# Patient Record
Sex: Female | Born: 2000 | Hispanic: Yes | Marital: Married | Smoking: Never smoker
Health system: Southern US, Community
[De-identification: ages and names within clinical notes are randomized; demographics above are authoritative.]

## PROBLEM LIST (undated history)

## (undated) DIAGNOSIS — M419 Scoliosis, unspecified: Secondary | ICD-10-CM

## (undated) HISTORY — DX: Scoliosis, unspecified: M41.9

## (undated) HISTORY — PX: TONSILLECTOMY: SUR1361

## (undated) HISTORY — PX: TONSILLECTOMY: SHX5217

---
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
## 2022-02-15 ENCOUNTER — Other Ambulatory Visit: Payer: Self-pay

## 2022-02-15 ENCOUNTER — Inpatient Hospital Stay (HOSPITAL_COMMUNITY)
Admission: AD | Admit: 2022-02-15 | Discharge: 2022-02-15 | Disposition: A | Discharge status: Home / Self Care | Payer: Medicaid Other | Attending: Obstetrics and Gynecology | Admitting: Obstetrics and Gynecology

## 2022-02-15 ENCOUNTER — Encounter (HOSPITAL_COMMUNITY): Payer: Self-pay

## 2022-02-15 DIAGNOSIS — Z3200 Encounter for pregnancy test, result unknown: Secondary | ICD-10-CM | POA: Insufficient documentation

## 2022-02-15 LAB — HCG, QUANTITATIVE, PREGNANCY: hCG, Beta Chain, Quant, S: 11 m[IU]/mL — ABNORMAL HIGH (ref <5)

## 2022-02-15 LAB — POCT PREGNANCY, URINE: Preg Test, Ur: NEGATIVE

## 2022-02-15 NOTE — MAU Note (Signed)
Pt okay to leave once labs are drawn per CNM.

## 2022-02-15 NOTE — MAU Note (Signed)
Katherine Barrera is a 22 y.o. here in MAU reporting: had multiple + UPT on Sunday. Last night started spotting and today it was a little bit heavier. Not having to wear a pad. Having some mild cramping.  LMP: 01/11/22  Onset of complaint: last night  Pain score: 4/10  Vitals:   02/15/22 1119  BP: 122/70  Pulse: 79  Resp: 16  Temp: 98.4 F (36.9 C)  SpO2: 95%     FHT:NA  Lab orders placed from triage: upt

## 2022-02-15 NOTE — MAU Provider Note (Signed)
Event Date/Time   First Provider Initiated Contact with Patient 02/15/22 1126      S Katherine Barrera is a 22 y.o. patient who presents to MAU today with complaint of spotting. She reports she had positive pregnancy tests on Sunday. She reports she started seeing spotting today. She denies any pain   O BP 122/70 (BP Location: Right Arm)   Pulse 79   Temp 98.4 F (36.9 C) (Oral)   Resp 16   Ht 5\' 3"  (1.6 m)   Wt 76.8 kg   LMP 01/11/2022   SpO2 95% Comment: room air  BMI 29.99 kg/m  Physical Exam Vitals and nursing note reviewed.  Constitutional:      General: She is not in acute distress.    Appearance: She is well-developed.  HENT:     Head: Normocephalic.  Eyes:     Pupils: Pupils are equal, round, and reactive to light.  Cardiovascular:     Rate and Rhythm: Normal rate and regular rhythm.     Heart sounds: Normal heart sounds.  Pulmonary:     Effort: Pulmonary effort is normal. No respiratory distress.     Breath sounds: Normal breath sounds.  Abdominal:     General: Bowel sounds are normal. There is no distension.     Palpations: Abdomen is soft.     Tenderness: There is no abdominal tenderness.  Skin:    General: Skin is warm and dry.  Neurological:     Mental Status: She is alert and oriented to person, place, and time.  Psychiatric:        Mood and Affect: Mood normal.        Behavior: Behavior normal.        Thought Content: Thought content normal.        Judgment: Judgment normal.    Results for orders placed or performed during the hospital encounter of 02/15/22 (from the past 24 hour(s))  Pregnancy, urine POC     Status: None   Collection Time: 02/15/22 11:15 AM  Result Value Ref Range   Preg Test, Ur NEGATIVE NEGATIVE  hCG, quantitative, pregnancy     Status: Abnormal   Collection Time: 02/15/22 11:35 AM  Result Value Ref Range   hCG, Beta Chain, Quant, S 11 (H) <5 mIU/mL     A Medical screening exam complete 1. Possible  pregnancy     Discussed negative UPT and recommended HCG. Patient agreeable to HCG.   Called patient to discuss positive HCG and recommendation for repeat in 48 hours. No appointments available in office, will return to MAU. Patient verbalized understanding of plan of care.   P -Discharge home in stable condition -Strict ectopic and miscarriage precautions discussed -Patient advised to follow-up with MAU on Friday for repeat labs -Patient may return to MAU as needed or if her condition were to change or worsen   Katherine Barrera, [place] 02/15/2022 11:26 AM

## 2022-02-17 ENCOUNTER — Inpatient Hospital Stay (HOSPITAL_COMMUNITY)
Admission: AD | Admit: 2022-02-17 | Discharge: 2022-02-17 | Disposition: A | Discharge status: Home / Self Care | Payer: Medicaid Other | Attending: Obstetrics and Gynecology | Admitting: Obstetrics and Gynecology

## 2022-02-17 ENCOUNTER — Encounter (HOSPITAL_COMMUNITY): Payer: Self-pay | Admitting: *Deleted

## 2022-02-17 ENCOUNTER — Inpatient Hospital Stay (HOSPITAL_COMMUNITY)
Admission: RE | Admit: 2022-02-17 | Discharge: 2022-02-17 | Disposition: A | Discharge status: Home / Self Care | Payer: Medicaid Other | Source: Ambulatory Visit | Attending: Obstetrics and Gynecology | Admitting: Obstetrics and Gynecology

## 2022-02-17 DIAGNOSIS — O039 Complete or unspecified spontaneous abortion without complication: Secondary | ICD-10-CM | POA: Diagnosis not present

## 2022-02-17 DIAGNOSIS — Z3A00 Weeks of gestation of pregnancy not specified: Secondary | ICD-10-CM

## 2022-02-17 LAB — HCG, QUANTITATIVE, PREGNANCY: hCG, Beta Chain, Quant, S: 2 m[IU]/mL (ref <5)

## 2022-02-17 NOTE — MAU Note (Signed)
Katherine Barrera Renold Don is a 22 y.o. at Unknown here in MAU reporting: here for repeat HCG.  Was here for spotting on Wed, when she got home she started cramping and her bleeding go heavier- like she was on her period.  Yesterday bleeding was lighter, never filled a pad. Today only seeing when she wipes or uses the restroom LMP: 12/27 Onset of complaint: Wed Pain score: none today Vitals:   02/17/22 1230  BP: 121/68  Pulse: 74  Resp: 16  Temp: 98.2 F (36.8 C)  SpO2: 99%      Lab orders placed from triage:  blood work has been drawn

## 2022-02-17 NOTE — MAU Provider Note (Signed)
History   Chief Complaint:  Follow-up  Katherine Barrera is  22 y.o. G1P0 Patient's last menstrual period was 01/11/2022.Marland Kitchen Patient is here for follow up of quantitative HCG and ongoing surveillance of pregnancy status. She is early pregnant  by LMP.   Since her last visit, the patient is without new complaint. The patient reports bleeding as  none now.  She denies any pain.  General ROS:  negative   Her previous Quantitative HCG values are:  1/31: 11  Physical Exam   Blood pressure 121/68, pulse 74, temperature 98.2 F (36.8 C), temperature source Oral, resp. rate 16, height 5\' 3"  (1.6 m), weight 75.9 kg, last menstrual period 01/11/2022, SpO2 99 %.   Physical Exam Vitals and nursing note reviewed.  Constitutional:      General: She is not in acute distress.    Appearance: She is well-developed.  HENT:     Head: Normocephalic.  Eyes:     Pupils: Pupils are equal, round, and reactive to light.  Cardiovascular:     Rate and Rhythm: Normal rate and regular rhythm.  Pulmonary:     Effort: Pulmonary effort is normal. No respiratory distress.     Breath sounds: Normal breath sounds.  Abdominal:     Palpations: Abdomen is soft.     Tenderness: There is no abdominal tenderness.  Musculoskeletal:        General: Normal range of motion.     Cervical back: Normal range of motion.  Skin:    General: Skin is warm and dry.  Neurological:     Mental Status: She is alert and oriented to person, place, and time.  Psychiatric:        Behavior: Behavior normal.        Thought Content: Thought content normal.        Judgment: Judgment normal.       Labs: Results for orders placed or performed during the hospital encounter of 02/17/22 (from the past 24 hour(s))  hCG, quantitative, pregnancy   Collection Time: 02/17/22 12:07 PM  Result Value Ref Range   hCG, Beta Chain, Quant, S 2 <5 mIU/mL   Assessment:   1. Miscarriage      Plan: -Discharge home in stable  condition -Patient advised to follow-up with OB as needed for gyn care -Patient may return to MAU as needed or if her condition were to change or worsen  [name], CNM 02/17/2022, 12:55 PM

## 2022-09-05 ENCOUNTER — Ambulatory Visit (INDEPENDENT_AMBULATORY_CARE_PROVIDER_SITE_OTHER): Payer: Medicaid Other | Admitting: *Deleted

## 2022-09-05 ENCOUNTER — Other Ambulatory Visit (INDEPENDENT_AMBULATORY_CARE_PROVIDER_SITE_OTHER): Payer: Medicaid Other

## 2022-09-05 VITALS — BP 123/82 | HR 83

## 2022-09-05 DIAGNOSIS — O3680X0 Pregnancy with inconclusive fetal viability, not applicable or unspecified: Secondary | ICD-10-CM

## 2022-09-05 DIAGNOSIS — Z1339 Encounter for screening examination for other mental health and behavioral disorders: Secondary | ICD-10-CM | POA: Diagnosis not present

## 2022-09-05 DIAGNOSIS — Z3A08 8 weeks gestation of pregnancy: Secondary | ICD-10-CM | POA: Diagnosis not present

## 2022-09-05 DIAGNOSIS — Z3481 Encounter for supervision of other normal pregnancy, first trimester: Secondary | ICD-10-CM

## 2022-09-05 DIAGNOSIS — Z3A09 9 weeks gestation of pregnancy: Secondary | ICD-10-CM

## 2022-09-05 DIAGNOSIS — Z3480 Encounter for supervision of other normal pregnancy, unspecified trimester: Secondary | ICD-10-CM

## 2022-09-05 MED ORDER — BLOOD PRESSURE KIT DEVI: 1.0000 | 0 refills | Status: AC

## 2022-09-05 NOTE — Progress Notes (Signed)
New OB Intake  I connected with Katherine Barrera  on 09/05/22 at  8:15 AM EDT by In Person Visit and verified that I am speaking with the correct person using two identifiers. Nurse is located at [facility] and pt is located at [place].  I discussed the limitations, risks, security and privacy concerns of performing an evaluation and management service by telephone and the availability of in person appointments. I also discussed with the patient that there may be a patient responsible charge related to this service. The patient expressed understanding and agreed to proceed.  I explained I am completing New OB Intake today. We discussed EDD of Not found.. Pt is G1P0. I reviewed her allergies, medications and Medical/Surgical/OB history.    Patient Active Problem List   Diagnosis Date Noted   Vitamin D deficiency 04/02/2017   Anxiety state 02/22/2015   Mild headache 02/22/2015   Numbness and tingling 02/22/2015   Chronic back pain 02/22/2015    Concerns addressed today  Delivery Plans Plans to deliver at [facility]. Discussed the nature of our practice with multiple providers including residents and students. Due to the size of the practice, the delivering provider may not be the same as those providing prenatal care.   Patient is not interested in water birth. Offered upcoming OB visit with CNM to discuss further.  MyChart/Babyscripts MyChart access verified. I explained pt will have some visits in office and some virtually. Babyscripts instructions given and order placed. Patient verifies receipt of registration text/e-mail. Account successfully created and app downloaded.  Blood Pressure Cuff/Weight Scale Blood pressure cuff ordered for patient to pick-up from [employer]. Explained after first prenatal appt pt will check weekly and document in Babyscripts. Patient does not have weight scale; patient may purchase if they desire to track weight weekly in Babyscripts.  Anatomy  US Explained first scheduled [place] will be around 19 weeks. Anatomy US scheduled for TBD at TBD.  Interested in [place]? If yes, send referral and doula dot phrase.   Is patient a candidate for Babyscripts Optimization? Yes  First visit review I reviewed new OB appt with patient. Explained pt will be seen by Sharen Counter, CNM at first visit. Discussed Avelina Laine genetic screening with patient. Considering [facility] and Horizon.. Routine prenatal labs  Collected at today's visit.    Last Pap No results found for: "DIAGPAP"  Harrel Lemon, RN 09/05/2022  8:28 AM

## 2022-09-05 NOTE — Patient Instructions (Signed)

## 2022-09-06 LAB — CBC/D/PLT+RPR+RH+ABO+RUBIGG...
Antibody Screen: NEGATIVE
Basophils Absolute: 0 10*3/uL (ref 0.0–0.2)
Basos: 1 %
EOS (ABSOLUTE): 0.3 10*3/uL (ref 0.0–0.4)
Eos: 3 %
HCV Ab: NONREACTIVE
HIV Screen 4th Generation wRfx: NONREACTIVE
Hematocrit: 38.4 % (ref 34.0–46.6)
Hemoglobin: 13.2 g/dL (ref 11.1–15.9)
Hepatitis B Surface Ag: NEGATIVE
Immature Grans (Abs): 0 10*3/uL (ref 0.0–0.1)
Immature Granulocytes: 0 %
Lymphocytes Absolute: 1.6 10*3/uL (ref 0.7–3.1)
Lymphs: 19 %
MCH: 32.6 pg (ref 26.6–33.0)
MCHC: 34.4 g/dL (ref 31.5–35.7)
MCV: 95 fL (ref 79–97)
Monocytes Absolute: 0.4 10*3/uL (ref 0.1–0.9)
Monocytes: 5 %
Neutrophils Absolute: 5.9 10*3/uL (ref 1.4–7.0)
Neutrophils: 72 %
Platelets: 279 10*3/uL (ref 150–450)
RBC: 4.05 x10E6/uL (ref 3.77–5.28)
RDW: 11.9 % (ref 11.7–15.4)
RPR Ser Ql: NONREACTIVE
Rh Factor: POSITIVE
Rubella Antibodies, IGG: 1.09 {index} (ref >0.99)
WBC: 8.3 10*3/uL (ref 3.4–10.8)

## 2022-09-06 LAB — HCV INTERPRETATION

## 2022-09-10 LAB — URINE CULTURE, OB REFLEX

## 2022-09-10 LAB — CULTURE, OB URINE

## 2022-09-20 ENCOUNTER — Encounter: Payer: Self-pay | Admitting: Advanced Practice Midwife

## 2022-09-20 ENCOUNTER — Other Ambulatory Visit (HOSPITAL_COMMUNITY)
Admission: RE | Admit: 2022-09-20 | Discharge: 2022-09-20 | Disposition: A | Discharge status: Home / Self Care | Payer: Medicaid Other | Source: Ambulatory Visit | Attending: Advanced Practice Midwife | Admitting: Advanced Practice Midwife

## 2022-09-20 ENCOUNTER — Ambulatory Visit (INDEPENDENT_AMBULATORY_CARE_PROVIDER_SITE_OTHER): Payer: Medicaid Other | Admitting: Advanced Practice Midwife

## 2022-09-20 VITALS — BP 114/68 | HR 87 | Wt 176.0 lb

## 2022-09-20 DIAGNOSIS — Z3481 Encounter for supervision of other normal pregnancy, first trimester: Secondary | ICD-10-CM

## 2022-09-20 DIAGNOSIS — Z3480 Encounter for supervision of other normal pregnancy, unspecified trimester: Secondary | ICD-10-CM

## 2022-09-20 DIAGNOSIS — Z3A10 10 weeks gestation of pregnancy: Secondary | ICD-10-CM

## 2022-09-20 NOTE — Progress Notes (Signed)
Pt presents for ROB visit. Pt c/o bloating.  Declines genetic testing Pt has concerns about dating  Desires PAP PP

## 2022-09-20 NOTE — Progress Notes (Signed)
Subjective:   Katherine Barrera is a 22 y.o. G2P0010 at [redacted]w[redacted]d by early ultrasound being seen today for her first obstetrical visit.  Her obstetrical history is significant for  SAB x 1  and has Anxiety state; Mild headache; Numbness and tingling; Chronic back pain; Vitamin D deficiency; and Supervision of other normal pregnancy, antepartum on their problem list.. Patient does intend to breast feed. Pregnancy history fully reviewed.  Patient reports no complaints.  HISTORY: OB History  Gravida Para Term Preterm AB Living  2 0 0 0 1 0  SAB IAB Ectopic Multiple Live Births  1 0 0 0 0    # Outcome Date GA Lbr Len/2nd Weight Sex Type Anes PTL Lv  2 Current           1 SAB 02/2022           Past Medical History:  Diagnosis Date   Scoliosis    Past Surgical History:  Procedure Laterality Date   TONSILLECTOMY Bilateral    TONSILLECTOMY     Family History  Problem Relation Age of Onset   Bipolar disorder Mother    Anxiety disorder Mother    Diabetes Paternal Grandmother    Kidney failure Paternal Grandfather    Epilepsy Cousin    Epilepsy Cousin        <1 Maternal 1st cousin has epilepsy   Schizophrenia Other    Social History   Tobacco Use   Smoking status: Never   Smokeless tobacco: Never  Vaping Use   Vaping status: Never Used  Substance Use Topics   Alcohol use: No   Drug use: Never   Allergies  Allergen Reactions   Other Swelling and Rash    Nuts; hazelnuts cause rash/swelling inside of the mouth   Pecan Nut (Diagnostic)    Current Outpatient Medications on File Prior to Visit  Medication Sig Dispense Refill   Blood Pressure Monitoring (BLOOD PRESSURE KIT) DEVI 1 Device by Does not apply route once a week. 1 each 0   Prenatal Vit-Fe Fumarate-FA (PRENATAL VITAMIN PO) Take 1 tablet by mouth daily.     amitriptyline (ELAVIL) 25 MG tablet Take 1 tablet (25 mg total) by mouth at bedtime. (Patient not taking: Reported on 09/20/2022) 30 tablet 3   b  complex vitamins tablet Take 1 tablet by mouth daily. (Patient not taking: Reported on 09/20/2022)     Magnesium Oxide 500 MG TABS Take by mouth. (Patient not taking: Reported on 09/20/2022)     tiZANidine (ZANAFLEX) 4 MG tablet Take 1 tablet twice per day (Patient not taking: Reported on 09/20/2022) 60 tablet 1   No current facility-administered medications on file prior to visit.     Indications for ASA therapy (per uptodate) One of the following: Previous pregnancy with preeclampsia, especially early onset and with an adverse outcome No Multifetal gestation No Chronic hypertension No Type 1 or 2 diabetes mellitus No Chronic kidney disease No Autoimmune disease (antiphospholipid syndrome, systemic lupus erythematosus) No   Two or more of the following: Nulliparity Yes Obesity (body mass index >30 kg/m2) No Family history of preeclampsia in mother or sister No Age ?35 years No Sociodemographic characteristics (African American race, low socioeconomic level) No Personal risk factors (eg, previous pregnancy with low birth weight or small for gestational age infant, previous adverse pregnancy outcome [eg, stillbirth], interval >10 years between pregnancies) No   Indications for early 1 hour GTT (per uptodate)  BMI >25 (>23 in Asian women)  AND one of the following  Gestational diabetes mellitus in a previous pregnancy No Glycated hemoglobin ?5.7 percent (39 mmol/mol), impaired glucose tolerance, or impaired fasting glucose on previous testing No First-degree relative with diabetes Yes High-risk race/ethnicity (eg, African American, Latino, Native American, [place] American, Pacific Islander) Yes History of cardiovascular disease No Hypertension or on therapy for hypertension No High-density lipoprotein cholesterol level <35 mg/dL (1.61 mmol/L) and/or a triglyceride level >250 mg/dL (0.96 mmol/L) No Polycystic ovary syndrome No Physical inactivity No Other clinical condition associated with  insulin resistance (eg, severe obesity, acanthosis nigricans) No Previous birth of an infant weighing ?4000 g No Previous stillbirth of unknown cause No Exam   Vitals:   09/20/22 1516  BP: 114/68  Pulse: 87  Weight: 176 lb (79.8 kg)      VS reviewed, nursing note reviewed,  Constitutional: well developed, well nourished, no distress HEENT: normocephalic CV: normal rate Pulm/chest wall: normal effort Abdomen: soft Neuro: alert and oriented x 3 Skin: warm, dry Psych: affect normal    Assessment:   Pregnancy: G2P0010 Patient Active Problem List   Diagnosis Date Noted   Supervision of other normal pregnancy, antepartum 09/05/2022   Vitamin D deficiency 04/02/2017   Anxiety state 02/22/2015   Mild headache 02/22/2015   Numbness and tingling 02/22/2015   Chronic back pain 02/22/2015     Plan:  1. Supervision of other normal pregnancy, antepartum --Anticipatory guidance about next visits/weeks of pregnancy given.  - Cervicovaginal ancillary only( [place])  2. [redacted] weeks gestation of pregnancy      Initial labs reviewed Continue prenatal vitamins. Discussed and offered genetic screening options, including Quad screen/AFP, NIPS testing, and option to decline testing. Benefits/risks/alternatives reviewed. Pt aware that anatomy US is form of genetic screening with lower accuracy in detecting trisomies than blood work.  Pt defers genetic screening today. NIPS:  at next visit  . Ultrasound discussed; fetal anatomic survey: ordered. Problem list reviewed and updated. The nature of  - [facility] Practice with multiple MDs and other Advanced Practice Providers was explained to patient; also emphasized that residents, students are part of our team. Routine obstetric precautions reviewed. Return in about 4 weeks (around 10/18/2022) for Any provider.   Sharen Counter, CNM 09/20/22 3:41 PM

## 2022-09-21 LAB — CERVICOVAGINAL ANCILLARY ONLY
Chlamydia: NEGATIVE
Comment: NEGATIVE
Comment: NEGATIVE
Comment: NORMAL
Neisseria Gonorrhea: NEGATIVE
Trichomonas: NEGATIVE

## 2022-10-05 ENCOUNTER — Encounter: Payer: Self-pay | Admitting: Advanced Practice Midwife

## 2022-10-06 ENCOUNTER — Ambulatory Visit (HOSPITAL_COMMUNITY)
Admission: RE | Admit: 2022-10-06 | Discharge: 2022-10-06 | Disposition: A | Discharge status: Home / Self Care | Payer: Medicaid Other | Source: Ambulatory Visit | Attending: Emergency Medicine | Admitting: Emergency Medicine

## 2022-10-06 ENCOUNTER — Encounter (HOSPITAL_COMMUNITY): Payer: Self-pay

## 2022-10-06 VITALS — BP 109/77 | HR 88 | Temp 98.3°F | Resp 16

## 2022-10-06 DIAGNOSIS — N390 Urinary tract infection, site not specified: Secondary | ICD-10-CM | POA: Diagnosis present

## 2022-10-06 DIAGNOSIS — Z3A13 13 weeks gestation of pregnancy: Secondary | ICD-10-CM | POA: Diagnosis present

## 2022-10-06 LAB — POCT URINALYSIS DIP (MANUAL ENTRY)
Bilirubin, UA: NEGATIVE
Glucose, UA: NEGATIVE mg/dL
Ketones, POC UA: NEGATIVE mg/dL
Nitrite, UA: NEGATIVE
Protein Ur, POC: NEGATIVE mg/dL
Spec Grav, UA: 1.02 (ref 1.010–1.025)
Urobilinogen, UA: 1 E.U./dL
pH, UA: 7.5 (ref 5.0–8.0)

## 2022-10-06 MED ORDER — CEPHALEXIN 500 MG PO CAPS: 500.0000 mg | ORAL_CAPSULE | Freq: Two times a day (BID) | ORAL | 0 refills | Status: AC

## 2022-10-06 NOTE — ED Provider Notes (Signed)
[facility]    CSN: [id number] Arrival date & time: 10/06/22  1734      History   Chief Complaint Chief Complaint  Patient presents with   Urinary Retention    Have 3 nights where I go pee and finish and I feel like I have to pee more but I can't. Not a burning but discomfort. Doesn't hurt to wipe just as I'm finishing peeing. I am [redacted] weeks pregnant so I want to make sure everything is okay - Entered by patient   Appointment    HPI Katherine Barrera is a 22 y.o. female.  3 day history of urinary frequency. She has no trouble urinating but after emptying the bladder she has urethral discomfort and feels she needs to go again. Not having dysuria She is [redacted] weeks pregnant Denies abdominal pain, nausea/vomiting, bleeding or spotting  She is followed by Haskel Khan   Past Medical History:  Diagnosis Date   Scoliosis     Patient Active Problem List   Diagnosis Date Noted   Supervision of other normal pregnancy, antepartum 09/05/2022   Vitamin D deficiency 04/02/2017   Anxiety state 02/22/2015   Mild headache 02/22/2015   Numbness and tingling 02/22/2015   Chronic back pain 02/22/2015    Past Surgical History:  Procedure Laterality Date   TONSILLECTOMY Bilateral    TONSILLECTOMY      OB History     Gravida  2   Para  0   Term  0   Preterm  0   AB  1   Living  0      SAB  1   IAB  0   Ectopic  0   Multiple  0   Live Births  0            Home Medications    Prior to Admission medications   Medication Sig Start Date End Date Taking? Authorizing Provider  cephALEXin (KEFLEX) 500 MG capsule Take 1 capsule (500 mg total) by mouth 2 (two) times daily for 7 days. 10/06/22 10/13/22 Yes Eliga Arvie, Lurena Joiner, PA-C  Blood Pressure Monitoring (BLOOD PRESSURE KIT) DEVI 1 Device by Does not apply route once a week. 09/05/22   Hermina Staggers, MD  Prenatal Vit-Fe Fumarate-FA (PRENATAL VITAMIN PO) Take 1 tablet by mouth daily.    [provider]    Family History Family History  Problem Relation Age of Onset   Bipolar disorder Mother    Anxiety disorder Mother    Diabetes Paternal Grandmother    Kidney failure Paternal Grandfather    Epilepsy Cousin    Epilepsy Cousin        <1 Maternal 1st cousin has epilepsy   Schizophrenia Other     Social History Social History   Tobacco Use   Smoking status: Never   Smokeless tobacco: Never  Vaping Use   Vaping status: Never Used  Substance Use Topics   Alcohol use: No   Drug use: Never     Allergies   Other and Pecan nut (diagnostic)   Review of Systems Review of Systems Per HPI  Physical Exam Triage Vital Signs ED Triage Vitals  Encounter Vitals Group     BP 10/06/22 1803 109/77     Systolic BP Percentile --      Diastolic BP Percentile --      Pulse Rate 10/06/22 1803 88     Resp 10/06/22 1803 16     Temp 10/06/22 1803 98.3  F (36.8 C)     Temp Source 10/06/22 1803 Oral     SpO2 10/06/22 1803 97 %     Weight --      Height --      Head Circumference --      Peak Flow --      Pain Score 10/06/22 1801 6     Pain Loc --      Pain Education --      Exclude from Growth Chart --    No data found.  Updated Vital Signs BP 109/77 (BP Location: Left Arm)   Pulse 88   Temp 98.3 F (36.8 C) (Oral)   Resp 16   LMP 06/28/2022   SpO2 97%   Visual Acuity Right Eye Distance:   Left Eye Distance:   Bilateral Distance:    Right Eye Near:   Left Eye Near:    Bilateral Near:     Physical Exam Vitals and nursing note reviewed.  Constitutional:      General: She is not in acute distress. HENT:     Mouth/Throat:     Mouth: Mucous membranes are moist.     Pharynx: Oropharynx is clear.  Eyes:     Conjunctiva/sclera: Conjunctivae normal.  Cardiovascular:     Rate and Rhythm: Normal rate and regular rhythm.     Heart sounds: Normal heart sounds.  Pulmonary:     Effort: Pulmonary effort is normal.     Breath sounds: Normal breath  sounds.  Abdominal:     General: Bowel sounds are normal.     Palpations: Abdomen is soft.     Tenderness: There is no abdominal tenderness. There is no right CVA tenderness, left CVA tenderness or guarding.  Neurological:     Mental Status: She is alert and oriented to person, place, and time.      UC Treatments / Results  Labs (all labs ordered are listed, but only abnormal results are displayed) Labs Reviewed  POCT URINALYSIS DIP (MANUAL ENTRY) - Abnormal; Notable for the following components:      Result Value   Clarity, UA cloudy (*)    Blood, UA trace-lysed (*)    Leukocytes, UA Small (1+) (*)    All other components within normal limits  URINE CULTURE    EKG   Radiology No results found.  Procedures Procedures (including critical care time)  Medications Ordered in UC Medications - No data to display  Initial Impression / Assessment and Plan / UC Course  I have reviewed the triage vital signs and the nursing notes.  Pertinent labs & imaging results that were available during my care of the patient were reviewed by me and considered in my medical decision making (see chart for details).  UA small leuks and trace RBC Culture pending With current pregnancy, treat with keflex BID x 7 days Increase fluids Follow with ob/gyn Discussed strict return and MAU precautions All questions answered. Patient agreeable to plan  Final Clinical Impressions(s) / UC Diagnoses   Final diagnoses:  Lower urinary tract infectious disease  [redacted] weeks gestation of pregnancy     Discharge Instructions      Please take medication as prescribed. Take with food to avoid upset stomach. Continue drinking lots of fluids! Follow up with ob/gyn  We will call you if anything on urine culture requires a change in treatment (2-3 days)     ED Prescriptions     Medication Sig Dispense Auth. Provider   cephALEXin (  KEFLEX) 500 MG capsule Take 1 capsule (500 mg total) by mouth 2  (two) times daily for 7 days. 14 capsule Rozina Pointer, Lurena Joiner, PA-C      PDMP not reviewed this encounter.   Layli Capshaw, Ray Church 10/06/22 5638

## 2022-10-06 NOTE — ED Triage Notes (Signed)
Urinary retention last three day patient denies burning but has discomfort.  Pt is [redacted] weeks pregnant

## 2022-10-06 NOTE — Discharge Instructions (Addendum)
Please take medication as prescribed. Take with food to avoid upset stomach. Continue drinking lots of fluids! Follow up with ob/gyn  We will call you if anything on urine culture requires a change in treatment (2-3 days)

## 2022-10-09 LAB — URINE CULTURE

## 2022-10-20 ENCOUNTER — Encounter: Payer: Self-pay | Admitting: Certified Nurse Midwife

## 2022-10-20 ENCOUNTER — Ambulatory Visit (INDEPENDENT_AMBULATORY_CARE_PROVIDER_SITE_OTHER): Payer: Medicaid Other | Admitting: Certified Nurse Midwife

## 2022-10-20 VITALS — BP 108/68 | HR 78 | Wt 176.0 lb

## 2022-10-20 DIAGNOSIS — N949 Unspecified condition associated with female genital organs and menstrual cycle: Secondary | ICD-10-CM

## 2022-10-20 DIAGNOSIS — Z3492 Encounter for supervision of normal pregnancy, unspecified, second trimester: Secondary | ICD-10-CM

## 2022-10-20 DIAGNOSIS — Z3A15 15 weeks gestation of pregnancy: Secondary | ICD-10-CM

## 2022-10-20 DIAGNOSIS — R339 Retention of urine, unspecified: Secondary | ICD-10-CM

## 2022-10-20 DIAGNOSIS — Z3480 Encounter for supervision of other normal pregnancy, unspecified trimester: Secondary | ICD-10-CM

## 2022-10-20 NOTE — Progress Notes (Signed)
   PRENATAL VISIT NOTE  Subjective:  Katherine Barrera is a 22 y.o. G2P0010 at [redacted]w[redacted]d being seen today for ongoing prenatal care.  She is currently monitored for the following issues for this low-risk pregnancy and has Anxiety state; Mild headache; Numbness and tingling; Chronic back pain; Vitamin D deficiency; and Supervision of other normal pregnancy, antepartum on their problem list.  Patient reports no complaints.  Contractions: Not present. Vag. Bleeding: None.  Movement: Present. Denies leaking of fluid.   The following portions of the patient's history were reviewed and updated as appropriate: allergies, current medications, past family history, past medical history, past social history, past surgical history and problem list.   Objective:   Vitals:   10/20/22 0858  BP: 108/68  Pulse: 78  Weight: 176 lb (79.8 kg)    Fetal Status: Fetal Heart Rate (bpm): 155   Movement: Present     General:  Alert, oriented and cooperative. Patient is in no acute distress.  Skin: Skin is warm and dry. No rash noted.   Cardiovascular: Normal heart rate noted  Respiratory: Normal respiratory effort, no problems with respiration noted  Abdomen: Soft, gravid, appropriate for gestational age.  Pain/Pressure: Absent     Pelvic: Cervical exam deferred        Extremities: Normal range of motion.  Edema: Trace  Mental Status: Normal mood and affect. Normal behavior. Normal judgment and thought content.   Assessment and Plan:  Pregnancy: G2P0010 at [redacted]w[redacted]d 1. Encounter for supervision of low-risk pregnancy in second trimester - Patient doing well.  - Reports occasional flutters and taps. Reassured patient of 2nd trimester fetal movement expectations.   2. [redacted] weeks gestation of pregnancy - Patient declines AFP .  3. Urinary retention - Patient recently treated for UTI and reports no urinary retention since completing antibiotics.   4. Round ligament pain - Reviewed normal discomforts of  pregnancy.  - Demonstrated some stretches that may help with discomfort - Also reviewed the use of a maternity support belt as the pregnancy continues to develop.   Preterm labor symptoms and general obstetric precautions including but not limited to vaginal bleeding, contractions, leaking of fluid and fetal movement were reviewed in detail with the patient. Please refer to After Visit Summary for other counseling recommendations.   Return in about 4 weeks (around 11/17/2022) for LOB.  Future Appointments  Date Time Provider Department Center  11/17/2022  8:35 AM Lennart Pall, MD [facility] None  11/24/2022  9:15 AM [facility] NURSE [facility]  11/24/2022  9:30 AM [facility] US4 [facility]  12/08/2022  8:35 AM Lennart Pall, MD [facility] None   Izabella Marcantel Danella Deis) Suzie Portela, MSN, CNM  Center for [facility]  10/20/2022 11:50 AM

## 2022-10-20 NOTE — Progress Notes (Signed)
Pt presents for ROB visit. Declines AFP

## 2022-10-20 NOTE — Patient Instructions (Signed)

## 2022-11-16 ENCOUNTER — Ambulatory Visit: Payer: Medicaid Other

## 2022-11-17 ENCOUNTER — Ambulatory Visit (INDEPENDENT_AMBULATORY_CARE_PROVIDER_SITE_OTHER): Payer: Medicaid Other | Admitting: Obstetrics and Gynecology

## 2022-11-17 VITALS — BP 104/69 | HR 87 | Wt 181.0 lb

## 2022-11-17 DIAGNOSIS — Z3A19 19 weeks gestation of pregnancy: Secondary | ICD-10-CM

## 2022-11-17 DIAGNOSIS — Z23 Encounter for immunization: Secondary | ICD-10-CM | POA: Diagnosis not present

## 2022-11-17 DIAGNOSIS — Z3480 Encounter for supervision of other normal pregnancy, unspecified trimester: Secondary | ICD-10-CM

## 2022-11-17 NOTE — Progress Notes (Signed)
ROB, Pt declines AFP.

## 2022-11-17 NOTE — Progress Notes (Signed)
   PRENATAL VISIT NOTE  Subjective:  Katherine Barrera is a 22 y.o. G2P0010 at [redacted]w[redacted]d being seen today for ongoing prenatal care.  She is currently monitored for the following issues for this low-risk pregnancy and has Anxiety state; Mild headache; Numbness and tingling; Chronic back pain; Vitamin D deficiency; and Supervision of other normal pregnancy, antepartum on their problem list.  Patient reports no complaints. Not yet feeling consistent fetal movement.  Contractions: Not present. Vag. Bleeding: None.    The following portions of the patient's history were reviewed and updated as appropriate: allergies, current medications, past family history, past medical history, past social history, past surgical history and problem list.   Objective:   Vitals:   11/17/22 0858  BP: 104/69  Pulse: 87  Weight: 181 lb (82.1 kg)    Fetal Status: Fetal Heart Rate (bpm): 153         General:  Alert, oriented and cooperative. Patient is in no acute distress.  Skin: Skin is warm and dry. No rash noted.   Cardiovascular: Normal heart rate noted  Respiratory: Normal respiratory effort, no problems with respiration noted  Abdomen: Soft, gravid, appropriate for gestational age.  Pain/Pressure: Absent      Assessment and Plan:  Pregnancy: G2P0010 at [redacted]w[redacted]d 1. Supervision of other normal pregnancy, antepartum 2. [redacted] weeks gestation of pregnancy Anatomy US scheduled 11/8 Declines genetics Reviewed cone healthy baby website - Flu vaccine trivalent PF, 6mos and older(Flulaval,Afluria,Fluarix,Fluzone)  Please refer to After Visit Summary for other counseling recommendations.   Return in about 5 weeks (around 12/22/2022) for return OB at 24 weeks.  Future Appointments  Date Time Provider Department Center  11/24/2022  9:15 AM [facility] NURSE [facility]  11/24/2022  9:30 AM [facility] US4 [facility]  12/08/2022  8:35 AM Lennart Pall, MD [facility] None   Lennart Pall, MD

## 2022-11-24 ENCOUNTER — Ambulatory Visit: Payer: Medicaid Other | Attending: Obstetrics and Gynecology | Admitting: *Deleted

## 2022-11-24 ENCOUNTER — Encounter: Payer: Self-pay | Admitting: *Deleted

## 2022-11-24 ENCOUNTER — Other Ambulatory Visit: Payer: Self-pay | Admitting: *Deleted

## 2022-11-24 ENCOUNTER — Other Ambulatory Visit: Payer: Self-pay

## 2022-11-24 ENCOUNTER — Ambulatory Visit (HOSPITAL_BASED_OUTPATIENT_CLINIC_OR_DEPARTMENT_OTHER): Payer: Medicaid Other

## 2022-11-24 VITALS — BP 113/59 | HR 77

## 2022-11-24 DIAGNOSIS — Z363 Encounter for antenatal screening for malformations: Secondary | ICD-10-CM | POA: Insufficient documentation

## 2022-11-24 DIAGNOSIS — Z3A21 21 weeks gestation of pregnancy: Secondary | ICD-10-CM | POA: Insufficient documentation

## 2022-11-24 DIAGNOSIS — Z3480 Encounter for supervision of other normal pregnancy, unspecified trimester: Secondary | ICD-10-CM

## 2022-11-24 DIAGNOSIS — O321XX0 Maternal care for breech presentation, not applicable or unspecified: Secondary | ICD-10-CM | POA: Insufficient documentation

## 2022-11-24 DIAGNOSIS — O0990 Supervision of high risk pregnancy, unspecified, unspecified trimester: Secondary | ICD-10-CM

## 2022-11-24 DIAGNOSIS — Z3689 Encounter for other specified antenatal screening: Secondary | ICD-10-CM

## 2022-12-08 ENCOUNTER — Ambulatory Visit (INDEPENDENT_AMBULATORY_CARE_PROVIDER_SITE_OTHER): Payer: Medicaid Other | Admitting: Obstetrics and Gynecology

## 2022-12-08 VITALS — BP 105/66 | HR 79 | Wt 185.0 lb

## 2022-12-08 DIAGNOSIS — Z3480 Encounter for supervision of other normal pregnancy, unspecified trimester: Secondary | ICD-10-CM

## 2022-12-08 DIAGNOSIS — Z3A23 23 weeks gestation of pregnancy: Secondary | ICD-10-CM

## 2022-12-08 NOTE — Progress Notes (Signed)
   PRENATAL VISIT NOTE  Subjective:  Katherine Barrera is a 22 y.o. G2P0010 at [redacted]w[redacted]d being seen today for ongoing prenatal care.  She is currently monitored for the following issues for this low-risk pregnancy and has Anxiety state; Mild headache; Numbness and tingling; Chronic back pain; Vitamin D deficiency; and Supervision of other normal pregnancy, antepartum on their problem list.  Patient reports no complaints.  Contractions: Not present. Vag. Bleeding: None.  Movement: Present. Denies leaking of fluid.   The following portions of the patient's history were reviewed and updated as appropriate: allergies, current medications, past family history, past medical history, past social history, past surgical history and problem list.   Objective:   Vitals:   12/08/22 0844  BP: 105/66  Pulse: 79  Weight: 185 lb (83.9 kg)    Fetal Status: Fetal Heart Rate (bpm): 150   Movement: Present     General:  Alert, oriented and cooperative. Patient is in no acute distress.  Skin: Skin is warm and dry. No rash noted.   Cardiovascular: Normal heart rate noted  Respiratory: Normal respiratory effort, no problems with respiration noted  Abdomen: Soft, gravid, appropriate for gestational age.  Pain/Pressure: Absent      Assessment and Plan:  Pregnancy: G2P0010 at [redacted]w[redacted]d 1. Supervision of other normal pregnancy, antepartum 2. [redacted] weeks gestation of pregnancy Incomplete anatomy, follow up scheduled 12/6 Discussed 2hGTT, CBC, HIV/RPR and tdap next appointment & need to fast  Preterm labor symptoms and general obstetric precautions including but not limited to vaginal bleeding, contractions, leaking of fluid and fetal movement were reviewed in detail with the patient. Please refer to After Visit Summary for other counseling recommendations.   Return in about 5 weeks (around 01/12/2023) for return OB at 28 weeks with 2h GTT, CBC, HIV, RPR, & tdap.  Future Appointments  Date Time Provider  Department Center  12/22/2022  7:30 AM [facility] US5 [facility]    Lennart Pall, MD

## 2022-12-12 ENCOUNTER — Encounter: Payer: Self-pay | Admitting: Obstetrics and Gynecology

## 2022-12-22 ENCOUNTER — Ambulatory Visit: Payer: Medicaid Other | Attending: Obstetrics and Gynecology

## 2022-12-22 ENCOUNTER — Other Ambulatory Visit: Payer: Self-pay

## 2022-12-22 DIAGNOSIS — Z363 Encounter for antenatal screening for malformations: Secondary | ICD-10-CM | POA: Insufficient documentation

## 2022-12-22 DIAGNOSIS — Z3A25 25 weeks gestation of pregnancy: Secondary | ICD-10-CM | POA: Diagnosis not present

## 2022-12-22 DIAGNOSIS — O358XX0 Maternal care for other (suspected) fetal abnormality and damage, not applicable or unspecified: Secondary | ICD-10-CM

## 2022-12-22 DIAGNOSIS — O0990 Supervision of high risk pregnancy, unspecified, unspecified trimester: Secondary | ICD-10-CM | POA: Insufficient documentation

## 2023-01-12 ENCOUNTER — Ambulatory Visit (INDEPENDENT_AMBULATORY_CARE_PROVIDER_SITE_OTHER): Payer: Medicaid Other | Admitting: Obstetrics and Gynecology

## 2023-01-12 ENCOUNTER — Other Ambulatory Visit: Payer: Medicaid Other

## 2023-01-12 ENCOUNTER — Encounter: Payer: Self-pay | Admitting: Obstetrics and Gynecology

## 2023-01-12 VITALS — BP 107/63 | HR 95 | Wt 195.6 lb

## 2023-01-12 DIAGNOSIS — Z3480 Encounter for supervision of other normal pregnancy, unspecified trimester: Secondary | ICD-10-CM

## 2023-01-12 DIAGNOSIS — Z23 Encounter for immunization: Secondary | ICD-10-CM | POA: Diagnosis not present

## 2023-01-12 DIAGNOSIS — Z3A28 28 weeks gestation of pregnancy: Secondary | ICD-10-CM

## 2023-01-12 NOTE — Progress Notes (Signed)
   PRENATAL VISIT NOTE  Subjective:  Katherine Barrera is a 22 y.o. G2P0010 at [redacted]w[redacted]d being seen today for ongoing prenatal care.  She is currently monitored for the following issues for this low-risk pregnancy and has Anxiety state; Mild headache; Numbness and tingling; Chronic back pain; Vitamin D deficiency; and Supervision of other normal pregnancy, antepartum on their problem list.  Patient reports  clear bilateral nipple discharge, minimal .  Contractions: Not present. Vag. Bleeding: None.  Movement: Present. Denies leaking of fluid.   The following portions of the patient's history were reviewed and updated as appropriate: allergies, current medications, past family history, past medical history, past social history, past surgical history and problem list.   Objective:   Vitals:   01/12/23 0941  BP: 107/63  Pulse: 95  Weight: 195 lb 9.6 oz (88.7 kg)    Fetal Status: Fetal Heart Rate (bpm): 150 Fundal Height: 28 cm Movement: Present     General:  Alert, oriented and cooperative. Patient is in no acute distress.  Skin: Skin is warm and dry. No rash noted.   Cardiovascular: Normal heart rate noted  Respiratory: Normal respiratory effort, no problems with respiration noted  Abdomen: Soft, gravid, appropriate for gestational age.  Pain/Pressure: Absent      Assessment and Plan:  Pregnancy: G2P0010 at [redacted]w[redacted]d 1. Supervision of other normal pregnancy, antepartum (Primary) 2. [redacted] weeks gestation of pregnancy Tdap today - Glucose Tolerance, 2 Hours w/1 Hour - RPR - CBC - HIV antibody (with reflex)  Please refer to After Visit Summary for other counseling recommendations.   Return in about 2 weeks (around 01/26/2023) for return OB at 30 weeks.  Future Appointments  Date Time Provider Department Center  01/12/2023 11:15 AM Lennart Pall, MD [facility] None   Lennart Pall, MD

## 2023-01-12 NOTE — Progress Notes (Signed)
Pt presents for ROB. No questions or concerns. 2 hour GTT today

## 2023-01-12 NOTE — Progress Notes (Deleted)
ROB/GTT. 

## 2023-01-12 NOTE — Addendum Note (Signed)
Addended by: Gavin Potters on: 01/12/2023 10:21 AM   Modules accepted: Orders

## 2023-01-13 LAB — CBC
Hematocrit: 33.1 % — ABNORMAL LOW (ref 34.0–46.6)
Hemoglobin: 11.1 g/dL (ref 11.1–15.9)
MCH: 32.3 pg (ref 26.6–33.0)
MCHC: 33.5 g/dL (ref 31.5–35.7)
MCV: 96 fL (ref 79–97)
Platelets: 264 10*3/uL (ref 150–450)
RBC: 3.44 x10E6/uL — ABNORMAL LOW (ref 3.77–5.28)
RDW: 12.4 % (ref 11.7–15.4)
WBC: 12.1 10*3/uL — ABNORMAL HIGH (ref 3.4–10.8)

## 2023-01-13 LAB — HIV ANTIBODY (ROUTINE TESTING W REFLEX): HIV Screen 4th Generation wRfx: NONREACTIVE

## 2023-01-13 LAB — RPR: RPR Ser Ql: NONREACTIVE

## 2023-01-13 LAB — GLUCOSE TOLERANCE, 2 HOURS W/ 1HR
Glucose, 1 hour: 127 mg/dL (ref 70–179)
Glucose, 2 hour: 122 mg/dL (ref 70–152)
Glucose, Fasting: 87 mg/dL (ref 70–91)

## 2023-01-17 NOTE — L&D Delivery Note (Addendum)
 OB/GYN Faculty Practice Delivery Note  Donelle Hise is a 23 y.o. G2P0010 s/p NSVD at [redacted]w[redacted]d. She was admitted for SOL.   ROM: 7h 61m with clear fluid GBS Status:  Negative/-- (02/21 0930) Maximum Maternal Temperature: 98.1 F  Labor Progress: Initial SVE: 4/80/0. Epidural at 0424. AROM with clear fluid at 0455. She then progressed to complete at 1104. Pit started as contractions spaced out markedly at complete.  Delivery Date/Time: 03/19/23 1249 Delivery: Called to room and patient was complete and pushing. Head delivered. No nuchal cord but compound hand present. Shoulder and body delivered in usual fashion. Infant with spontaneous cry, placed on mother's abdomen, dried and stimulated. Cord clamped x 2 after 1-minute delay, and cut by FOB. Cord blood drawn. Placenta delivered spontaneously with gentle cord traction. Fundus firm with massage and Pitocin. Labia, perineum, vagina, and cervix inspected with 2nd degree perineal laceration repaired with 3-0 vicryl, hemostatic periurethral and bilateral labial abrasions not requiring repair.  Baby Weight: pending  Placenta: 3 vessel, intact. Sent to L&D. Complications: None Lacerations: 2nd degree perineal laceration repaired with 3-0 vicryl, hemostatic periurethral and bilateral labial abrasions not requiring repair EBL: 147 mL Analgesia: Epidural   Infant:  APGAR (1 MIN): 8  APGAR (5 MINS): 9   Claria Dice, MD Family Medicine Resident, PGY-1 03/19/2023, 1:06 PM  Attestation of Supervision of Student: I was present, gloved, and participated in the entirety of delivery and third stage.  Joanne Gavel, MD OB Fellow 03/19/2023 1:54 PM

## 2023-02-02 ENCOUNTER — Ambulatory Visit (INDEPENDENT_AMBULATORY_CARE_PROVIDER_SITE_OTHER): Payer: Medicaid Other | Admitting: Obstetrics & Gynecology

## 2023-02-02 VITALS — BP 113/66 | HR 93 | Wt 196.0 lb

## 2023-02-02 DIAGNOSIS — Z3480 Encounter for supervision of other normal pregnancy, unspecified trimester: Secondary | ICD-10-CM

## 2023-02-02 DIAGNOSIS — Z3A31 31 weeks gestation of pregnancy: Secondary | ICD-10-CM

## 2023-02-02 DIAGNOSIS — E559 Vitamin D deficiency, unspecified: Secondary | ICD-10-CM

## 2023-02-02 MED ORDER — VITAMIN D (ERGOCALCIFEROL) 1.25 MG (50000 UNIT) PO CAPS: 50000.0000 [IU] | ORAL_CAPSULE | ORAL | 0 refills | Status: DC

## 2023-02-02 NOTE — Progress Notes (Signed)
Pt presents for ROB. No questions or concerns. 

## 2023-02-02 NOTE — Progress Notes (Addendum)
   PRENATAL VISIT NOTE  Subjective:  Katherine Barrera is a 23 y.o. G2P0010 at [redacted]w[redacted]d being seen today for ongoing prenatal care.  She is currently monitored for the following issues for this low-risk pregnancy and has Anxiety state; Mild headache; Numbness and tingling; Chronic back pain; Vitamin D deficiency; and Supervision of other normal pregnancy, antepartum on their problem list.  Patient reports no complaints.  Contractions: Irritability. Vag. Bleeding: None.  Movement: Present. Denies leaking of fluid.   The following portions of the patient's history were reviewed and updated as appropriate: allergies, current medications, past family history, past medical history, past social history, past surgical history and problem list.   Objective:   Vitals:   02/02/23 0849  BP: 113/66  Pulse: 93  Weight: 196 lb (88.9 kg)   FH- 30 cm FHR- 160s  Fetal Status: Fetal Heart Rate (bpm): 165   Movement: Present     General:  Alert, oriented and cooperative. Patient is in no acute distress.  Skin: Skin is warm and dry. No rash noted.   Cardiovascular: Normal heart rate noted  Respiratory: Normal respiratory effort, no problems with respiration noted  Abdomen: Soft, gravid, appropriate for gestational age.  Pain/Pressure: Absent     Pelvic: Cervical exam deferred        Extremities: Normal range of motion.  Edema: Trace  Mental Status: Normal mood and affect. Normal behavior. Normal judgment and thought content.   Assessment and Plan:  Pregnancy: G2P0010 at [redacted]w[redacted]d 1. Supervision of other normal pregnancy, antepartum (Primary)   2. [redacted] weeks gestation of pregnancy  3. Vitamin D deficiency- check level today  Preterm labor symptoms and general obstetric precautions including but not limited to vaginal bleeding, contractions, leaking of fluid and fetal movement were reviewed in detail with the patient. Please refer to After Visit Summary for other counseling recommendations.    Come back in 2 weeks  Allie Bossier, MD

## 2023-02-02 NOTE — Addendum Note (Signed)
Addended by: Allie Bossier on: 02/02/2023 09:27 AM   Modules accepted: Orders

## 2023-02-05 ENCOUNTER — Encounter: Payer: Self-pay | Admitting: Obstetrics and Gynecology

## 2023-02-13 LAB — VITAMIN D 1,25 DIHYDROXY
Vitamin D 1, 25 (OH)2 Total: 200 pg/mL — ABNORMAL HIGH
Vitamin D2 1, 25 (OH)2: <10 pg/mL
Vitamin D3 1, 25 (OH)2: 200 pg/mL

## 2023-02-23 ENCOUNTER — Encounter: Payer: Self-pay | Admitting: Obstetrics

## 2023-02-23 ENCOUNTER — Ambulatory Visit (INDEPENDENT_AMBULATORY_CARE_PROVIDER_SITE_OTHER): Payer: Medicaid Other | Admitting: Obstetrics

## 2023-02-23 VITALS — BP 111/66 | HR 89 | Wt 202.0 lb

## 2023-02-23 DIAGNOSIS — Z3480 Encounter for supervision of other normal pregnancy, unspecified trimester: Secondary | ICD-10-CM | POA: Diagnosis not present

## 2023-02-23 DIAGNOSIS — Z3A34 34 weeks gestation of pregnancy: Secondary | ICD-10-CM

## 2023-02-23 NOTE — Progress Notes (Signed)
   PRENATAL VISIT NOTE  Subjective:  Katherine Barrera is a 23 y.o. G2P0010 at [redacted]w[redacted]d being seen today for ongoing prenatal care.  She is currently monitored for the following issues for this low-risk pregnancy and has Anxiety state; Mild headache; Numbness and tingling; Chronic back pain; Vitamin D  deficiency; and Supervision of other normal pregnancy, antepartum on their problem list.  Patient reports heartburn.   .  .   . Denies leaking of fluid.   The following portions of the patient's history were reviewed and updated as appropriate: allergies, current medications, past family history, past medical history, past social history, past surgical history and problem list.   Objective:  There were no vitals filed for this visit.  Fetal Status:           General:  Alert, oriented and cooperative. Patient is in no acute distress.  Skin: Skin is warm and dry. No rash noted.   Cardiovascular: Normal heart rate noted  Respiratory: Normal respiratory effort, no problems with respiration noted  Abdomen: Soft, gravid, appropriate for gestational age.        Pelvic: Cervical exam deferred        Extremities: Normal range of motion.     Mental Status: Normal mood and affect. Normal behavior. Normal judgment and thought content.   Assessment and Plan:  Pregnancy: G2P0010 at [redacted]w[redacted]d 1. Supervision of other normal pregnancy, antepartum (Primary)  2. [redacted] weeks gestation of pregnancy   Preterm labor symptoms and general obstetric precautions including but not limited to vaginal bleeding, contractions, leaking of fluid and fetal movement were reviewed in detail with the patient. Please refer to After Visit Summary for other counseling recommendations.   No follow-ups on file.  Future Appointments  Date Time Provider Department Center  02/23/2023  8:55 AM [place], Camie LABOR, CNM [facility] None    CARLIN RONAL CENTERS, MD, FACOG Attending Obstetrician & Gynecologist, [facility]  for [facility], [facility], [place] 02/23/2023

## 2023-02-23 NOTE — Progress Notes (Signed)
 Pt presents for ROB visit. No concerns

## 2023-03-09 ENCOUNTER — Other Ambulatory Visit (HOSPITAL_COMMUNITY)
Admission: RE | Admit: 2023-03-09 | Discharge: 2023-03-09 | Disposition: A | Discharge status: Home / Self Care | Payer: Medicaid Other | Source: Ambulatory Visit | Attending: Obstetrics and Gynecology | Admitting: Obstetrics and Gynecology

## 2023-03-09 ENCOUNTER — Ambulatory Visit: Payer: Medicaid Other | Admitting: Obstetrics and Gynecology

## 2023-03-09 VITALS — BP 112/72 | HR 89 | Wt 206.6 lb

## 2023-03-09 DIAGNOSIS — Z3480 Encounter for supervision of other normal pregnancy, unspecified trimester: Secondary | ICD-10-CM

## 2023-03-09 DIAGNOSIS — Z3A36 36 weeks gestation of pregnancy: Secondary | ICD-10-CM | POA: Diagnosis not present

## 2023-03-09 NOTE — Progress Notes (Signed)
   LOW-RISK PREGNANCY OFFICE VISIT Patient name: Katherine Barrera MRN [id number]  Date of birth: 07-13-2000 Chief Complaint:   Routine Prenatal Visit  History of Present Illness:   Katherine Barrera is a 23 y.o. G2P0010 female at [redacted]w[redacted]d with an Estimated Date of Delivery: 04/04/23 being seen today for ongoing management of a low-risk pregnancy.  Today she reports  "bright" yellow vaginal discharge; no irritation . Contractions: Not present. Vag. Bleeding: None.  Movement: Present. denies leaking of fluid. Review of Systems:   Pertinent items are noted in HPI Denies abnormal vaginal discharge w/ itching/odor/irritation, headaches, visual changes, shortness of breath, chest pain, abdominal pain, severe nausea/vomiting, or problems with urination or bowel movements unless otherwise stated above. Pertinent History Reviewed:  Reviewed past medical,surgical, social, obstetrical and family history.  Reviewed problem list, medications and allergies. Physical Assessment:   Vitals:   03/09/23 0851  BP: 112/72  Pulse: 89  Weight: 206 lb 9.6 oz (93.7 kg)  Body mass index is 36.6 kg/m.        Physical Examination:   General appearance: Well appearing, and in no distress  Mental status: Alert, oriented to person, place, and time  Skin: Warm & dry  Cardiovascular: Normal heart rate noted  Respiratory: Normal respiratory effort, no distress  Abdomen: Soft, gravid, nontender  Pelvic: Cervical exam deferred         Extremities: Edema: Trace  Fetal Status: Fetal Heart Rate (bpm): 156 Fundal Height: 34 cm Movement: Present Presentation: Vertex  No results found for this or any previous visit (from the past 24 hours).  Assessment & Plan:  1) Low-risk pregnancy G2P0010 at [redacted]w[redacted]d with an Estimated Date of Delivery: 04/04/23   2) Supervision of other normal pregnancy, antepartum (Primary) - Culture, beta strep (group b only) - Cervicovaginal ancillary only( [place])  3) [redacted]  weeks gestation of pregnancy    Meds: No orders of the defined types were placed in this encounter.  Labs/procedures today: GBS and GC/CT  Plan:  Continue routine obstetrical care   Reviewed: Preterm labor symptoms and general obstetric precautions including but not limited to vaginal bleeding, contractions, leaking of fluid and fetal movement were reviewed in detail with the patient.  All questions were answered. Has home bp cuff. Check bp weekly, let us know if >140/90.   Follow-up: Return in about 1 week (around 03/16/2023) for Return OB visit.  Orders Placed This Encounter  Procedures   Culture, beta strep (group b only)   Raelyn Mora MSN, CNM 03/09/2023 9:11 AM

## 2023-03-09 NOTE — Patient Instructions (Signed)

## 2023-03-12 LAB — CERVICOVAGINAL ANCILLARY ONLY
Bacterial Vaginitis (gardnerella): NEGATIVE
Candida Glabrata: NEGATIVE
Candida Vaginitis: NEGATIVE
Chlamydia: NEGATIVE
Comment: NEGATIVE
Comment: NEGATIVE
Comment: NEGATIVE
Comment: NEGATIVE
Comment: NEGATIVE
Comment: NORMAL
Neisseria Gonorrhea: NEGATIVE
Trichomonas: NEGATIVE

## 2023-03-13 ENCOUNTER — Encounter: Payer: Self-pay | Admitting: Obstetrics and Gynecology

## 2023-03-13 LAB — CULTURE, BETA STREP (GROUP B ONLY): Strep Gp B Culture: NEGATIVE

## 2023-03-16 ENCOUNTER — Ambulatory Visit: Payer: Medicaid Other | Admitting: Obstetrics and Gynecology

## 2023-03-16 ENCOUNTER — Encounter: Payer: Self-pay | Admitting: Obstetrics and Gynecology

## 2023-03-16 VITALS — BP 112/74 | HR 90 | Wt 211.0 lb

## 2023-03-16 DIAGNOSIS — Z3483 Encounter for supervision of other normal pregnancy, third trimester: Secondary | ICD-10-CM

## 2023-03-16 DIAGNOSIS — Z3A37 37 weeks gestation of pregnancy: Secondary | ICD-10-CM

## 2023-03-16 DIAGNOSIS — Z1339 Encounter for screening examination for other mental health and behavioral disorders: Secondary | ICD-10-CM | POA: Diagnosis not present

## 2023-03-16 DIAGNOSIS — Z3480 Encounter for supervision of other normal pregnancy, unspecified trimester: Secondary | ICD-10-CM

## 2023-03-16 NOTE — Progress Notes (Signed)
   PRENATAL VISIT NOTE  Subjective:  Katherine Barrera is a 23 y.o. G2P0010 at [redacted]w[redacted]d being seen today for ongoing prenatal care.  She is currently monitored for the following issues for this low-risk pregnancy and has Anxiety state; Mild headache; Numbness and tingling; Chronic back pain; Vitamin D deficiency; and Supervision of other normal pregnancy, antepartum on their problem list.  Patient reports no complaints.  Contractions: Not present. Vag. Bleeding: None.  Movement: Present. Denies leaking of fluid.   The following portions of the patient's history were reviewed and updated as appropriate: allergies, current medications, past family history, past medical history, past social history, past surgical history and problem list.   Objective:   Vitals:   03/16/23 0951  BP: 112/74  Pulse: 90  Weight: 211 lb (95.7 kg)    Fetal Status: Fetal Heart Rate (bpm): 152 Fundal Height: 36 cm Movement: Present     General:  Alert, oriented and cooperative. Patient is in no acute distress.  Skin: Skin is warm and dry. No rash noted.   Cardiovascular: Normal heart rate noted  Respiratory: Normal respiratory effort, no problems with respiration noted  Abdomen: Soft, gravid, appropriate for gestational age.  Pain/Pressure: Absent     Pelvic: Cervical exam deferred        Extremities: Normal range of motion.  Edema: Trace  Mental Status: Normal mood and affect. Normal behavior. Normal judgment and thought content.   Assessment and Plan:  Pregnancy: G2P0010 at [redacted]w[redacted]d 1. Supervision of other normal pregnancy, antepartum (Primary) Patient is doing well without complaints Patient undecided on contraception and considering condoms  Term labor symptoms and general obstetric precautions including but not limited to vaginal bleeding, contractions, leaking of fluid and fetal movement were reviewed in detail with the patient. Please refer to After Visit Summary for other counseling  recommendations.   Return in about 1 week (around 03/23/2023) for in person, ROB, Low risk.  Future Appointments  Date Time Provider Department Center  03/23/2023  8:55 AM Sue Lush, FNP [facility] None  03/30/2023 11:15 AM Gerrit Heck, CNM [facility] None    Catalina Antigua, MD

## 2023-03-18 ENCOUNTER — Other Ambulatory Visit: Payer: Self-pay

## 2023-03-18 ENCOUNTER — Inpatient Hospital Stay (HOSPITAL_COMMUNITY)
Admission: AD | Admit: 2023-03-18 | Discharge: 2023-03-18 | Disposition: A | Discharge status: Home / Self Care | Source: Home / Self Care | Attending: Obstetrics & Gynecology | Admitting: Obstetrics & Gynecology

## 2023-03-18 ENCOUNTER — Encounter (HOSPITAL_COMMUNITY): Payer: Self-pay | Admitting: Obstetrics & Gynecology

## 2023-03-18 DIAGNOSIS — O471 False labor at or after 37 completed weeks of gestation: Secondary | ICD-10-CM | POA: Insufficient documentation

## 2023-03-18 DIAGNOSIS — Z3A37 37 weeks gestation of pregnancy: Secondary | ICD-10-CM | POA: Insufficient documentation

## 2023-03-18 DIAGNOSIS — O479 False labor, unspecified: Secondary | ICD-10-CM

## 2023-03-18 LAB — URINALYSIS, ROUTINE W REFLEX MICROSCOPIC
Bilirubin Urine: NEGATIVE
Glucose, UA: NEGATIVE mg/dL
Ketones, ur: NEGATIVE mg/dL
Nitrite: NEGATIVE
Protein, ur: 30 mg/dL — AB
Specific Gravity, Urine: 1.023 (ref 1.005–1.030)
WBC, UA: >50 WBC/hpf (ref 0–5)
pH: 7 (ref 5.0–8.0)

## 2023-03-18 MED ORDER — LACTATED RINGERS IV BOLUS
1000.0000 mL | Freq: Once | INTRAVENOUS | Status: AC
  Administered 2023-03-18: 1000 mL via INTRAVENOUS

## 2023-03-18 NOTE — Discharge Instructions (Signed)
 [employer] This circuit takes at least 90 minutes to complete so clear your schedule and make mental preparations so you can relax in your environment.  The second step requires a lot of pillows so gather them up before beginning.  Before starting, you should empty your bladder! Have a nice drink nearby, and make sure it has a straw! If you are having contractions, this circuit should be done through contractions, try not to change positions between steps.  Step One: Open-kneeChest Stay in this position for 30 minutes, start in cat/cow, then drop your chest as low as you can to the bed or the floor and your bottom as high as you can. Knees should be fairly wide apart, and the angle between the torso/thighs should be wider than 90 degrees. Wiggle around, prop with lots of pillows and use this time to get totally relaxed. This position allows the baby to scoot out of the pelvis a bit and gives them room to rotate, shift their head position, etc. If the pregnant person finds it helpful, careful positioning with a rebozo under the belly, with gentle tension from a support person behind can help maintain this position for the full 30 minutes.  Step BJY:[id number] SideLying Roll to your left side, bringing your top leg as high as possible and keeping your bottom leg straight. Roll forward as much as possible, again using a lot of pillows. Sink into the bed and relax some more. If you fall asleep, that's totally okay and you can stay there! If not, stay here for at least another half an hour. Try and get your top right leg up towards your head and get as rolled over onto your belly as much as possible. If you repeat the circuit during labor, try alternating left and right sides. We know the photo the left is actually right side... just flip the image in your head.  Step Three: Moving and Lunges Lunge, walk stairs facing sideways, 2 at a time, (have a spotter  downstairs of you!), take a walk outside with one foot on the curb and the other on the street, sit on a birth ball and hula- anything that's upright and putting your pelvis in open, asymmetrical positions. Spend at least 30 minutes doing this one as well to give your baby a chance to move down. If you are lunging or stair or curb walking, you should lunge/walk/go up stairs in the direction that feels better to you. The key with the lunge is that the toes of the higher leg and mom's belly button should be at right angles. Do not lunge over your knee, that closes the pelvis.  You got this!!!!!!!!!!!!!!!!!!!!!!! :)

## 2023-03-18 NOTE — MAU Provider Note (Addendum)
  S Ms. Katherine Barrera is a 23 y.o. G2P0010 patient who presents to MAU today with complaint of contractions. Denies LOF, scant bloody show. + fetal mvmt.   O BP 123/76 (BP Location: Right Arm)   Pulse 99   Temp 98.1 F (36.7 C) (Oral)   Resp 16   LMP 06/28/2022   SpO2 99%  Physical Exam Cervix change from 1/80 to 2.5/80. Fluid bolus given and there was no further cervical change. No other indication for admission.   A Medical screening exam complete by MAU provider False labor  P Discharge from MAU in stable condition Labor precautions and FKCs reviewed by RN  Katrinka Blazing, [place], CNM 03/18/2023 6:57 PM

## 2023-03-18 NOTE — MAU Note (Signed)
.  Katherine Barrera Katherine Barrera is a 23 y.o. at [redacted]w[redacted]d here in MAU reporting: cramping/ctx q 10 min since about 1100 this morning.  Reports some bloody show.  Denies LOF,  +FM   Onset of complaint: 1100 Pain score: 3 Vitals:   03/18/23 1609  BP: 123/76  Pulse: 99  Resp: 16  Temp: 98.1 F (36.7 C)  SpO2: 99%     FHT: 155 Lab orders placed from triage: ua

## 2023-03-19 ENCOUNTER — Inpatient Hospital Stay (HOSPITAL_COMMUNITY)
Admission: AD | Admit: 2023-03-19 | Discharge: 2023-03-21 | DRG: 807 | Disposition: A | Discharge status: Home / Self Care | Payer: Self-pay | Attending: Family Medicine | Admitting: Family Medicine

## 2023-03-19 ENCOUNTER — Encounter (HOSPITAL_COMMUNITY): Payer: Self-pay | Admitting: Family Medicine

## 2023-03-19 ENCOUNTER — Other Ambulatory Visit: Payer: Self-pay

## 2023-03-19 ENCOUNTER — Inpatient Hospital Stay (HOSPITAL_COMMUNITY): Admitting: Anesthesiology

## 2023-03-19 DIAGNOSIS — Z833 Family history of diabetes mellitus: Secondary | ICD-10-CM

## 2023-03-19 DIAGNOSIS — F411 Generalized anxiety disorder: Secondary | ICD-10-CM | POA: Diagnosis present

## 2023-03-19 DIAGNOSIS — O26893 Other specified pregnancy related conditions, third trimester: Secondary | ICD-10-CM | POA: Diagnosis present

## 2023-03-19 DIAGNOSIS — O99344 Other mental disorders complicating childbirth: Secondary | ICD-10-CM | POA: Diagnosis present

## 2023-03-19 DIAGNOSIS — Z3A37 37 weeks gestation of pregnancy: Secondary | ICD-10-CM | POA: Diagnosis not present

## 2023-03-19 DIAGNOSIS — O99214 Obesity complicating childbirth: Principal | ICD-10-CM | POA: Diagnosis present

## 2023-03-19 DIAGNOSIS — E66813 Obesity, class 3: Secondary | ICD-10-CM | POA: Diagnosis present

## 2023-03-19 DIAGNOSIS — Z3480 Encounter for supervision of other normal pregnancy, unspecified trimester: Secondary | ICD-10-CM

## 2023-03-19 DIAGNOSIS — O326XX0 Maternal care for compound presentation, not applicable or unspecified: Secondary | ICD-10-CM | POA: Diagnosis not present

## 2023-03-19 LAB — CBC
HCT: 36 % (ref 36.0–46.0)
Hemoglobin: 12.2 g/dL (ref 12.0–15.0)
MCH: 31.4 pg (ref 26.0–34.0)
MCHC: 33.9 g/dL (ref 30.0–36.0)
MCV: 92.8 fL (ref 80.0–100.0)
Platelets: 231 10*3/uL (ref 150–400)
RBC: 3.88 MIL/uL (ref 3.87–5.11)
RDW: 14 % (ref 11.5–15.5)
WBC: 12 10*3/uL — ABNORMAL HIGH (ref 4.0–10.5)
nRBC: 0 % (ref 0.0–0.2)

## 2023-03-19 LAB — ABO/RH: ABO/RH(D): O POS

## 2023-03-19 LAB — RPR: RPR Ser Ql: NONREACTIVE

## 2023-03-19 LAB — ANTIBODY SCREEN: Antibody Screen: NEGATIVE

## 2023-03-19 MED ORDER — DIPHENHYDRAMINE HCL 50 MG/ML IJ SOLN: 12.5000 mg | INTRAMUSCULAR | Status: DC | PRN

## 2023-03-19 MED ORDER — OXYTOCIN-SODIUM CHLORIDE 30-0.9 UT/500ML-% IV SOLN
2.5000 [IU]/h | INTRAVENOUS | Status: DC
  Filled 2023-03-19: qty 500

## 2023-03-19 MED ORDER — ONDANSETRON HCL 4 MG/2ML IJ SOLN: 4.0000 mg | Freq: Four times a day (QID) | INTRAMUSCULAR | Status: DC | PRN

## 2023-03-19 MED ORDER — SODIUM CHLORIDE 0.9 % IV SOLN: 250.0000 mL | INTRAVENOUS | Status: DC | PRN

## 2023-03-19 MED ORDER — LACTATED RINGERS IV SOLN: 500.0000 mL | Freq: Once | INTRAVENOUS | Status: DC

## 2023-03-19 MED ORDER — PHENYLEPHRINE 80 MCG/ML (10ML) SYRINGE FOR IV PUSH (FOR BLOOD PRESSURE SUPPORT): 80.0000 ug | PREFILLED_SYRINGE | INTRAVENOUS | Status: DC | PRN

## 2023-03-19 MED ORDER — LACTATED RINGERS IV SOLN: INTRAVENOUS | Status: DC

## 2023-03-19 MED ORDER — EPHEDRINE 5 MG/ML INJ: 10.0000 mg | INTRAVENOUS | Status: DC | PRN

## 2023-03-19 MED ORDER — SOD CITRATE-CITRIC ACID 500-334 MG/5ML PO SOLN: 30.0000 mL | ORAL | Status: DC | PRN

## 2023-03-19 MED ORDER — DIBUCAINE (PERIANAL) 1 % EX OINT: 1.0000 | TOPICAL_OINTMENT | CUTANEOUS | Status: DC | PRN

## 2023-03-19 MED ORDER — LIDOCAINE HCL (PF) 1 % IJ SOLN: 30.0000 mL | INTRAMUSCULAR | Status: DC | PRN

## 2023-03-19 MED ORDER — ZOLPIDEM TARTRATE 5 MG PO TABS: 5.0000 mg | ORAL_TABLET | Freq: Every evening | ORAL | Status: DC | PRN

## 2023-03-19 MED ORDER — IBUPROFEN 600 MG PO TABS
600.0000 mg | ORAL_TABLET | Freq: Four times a day (QID) | ORAL | Status: DC
  Administered 2023-03-19 – 2023-03-21 (×8): 600 mg via ORAL
  Filled 2023-03-19 (×8): qty 1

## 2023-03-19 MED ORDER — FENTANYL-BUPIVACAINE-NACL 0.5-0.125-0.9 MG/250ML-% EP SOLN
12.0000 mL/h | EPIDURAL | Status: DC | PRN
  Administered 2023-03-19: 12 mL/h via EPIDURAL
  Filled 2023-03-19: qty 250

## 2023-03-19 MED ORDER — DIPHENHYDRAMINE HCL 25 MG PO CAPS: 25.0000 mg | ORAL_CAPSULE | Freq: Four times a day (QID) | ORAL | Status: DC | PRN

## 2023-03-19 MED ORDER — OXYCODONE HCL 5 MG PO TABS: 10.0000 mg | ORAL_TABLET | ORAL | Status: DC | PRN

## 2023-03-19 MED ORDER — LIDOCAINE HCL (PF) 1 % IJ SOLN
INTRAMUSCULAR | Status: DC | PRN
  Administered 2023-03-19: 8 mL via EPIDURAL

## 2023-03-19 MED ORDER — OXYCODONE-ACETAMINOPHEN 5-325 MG PO TABS: 1.0000 | ORAL_TABLET | ORAL | Status: DC | PRN

## 2023-03-19 MED ORDER — OXYCODONE-ACETAMINOPHEN 5-325 MG PO TABS: 2.0000 | ORAL_TABLET | ORAL | Status: DC | PRN

## 2023-03-19 MED ORDER — TERBUTALINE SULFATE 1 MG/ML IJ SOLN: 0.2500 mg | Freq: Once | INTRAMUSCULAR | Status: DC | PRN

## 2023-03-19 MED ORDER — SENNOSIDES-DOCUSATE SODIUM 8.6-50 MG PO TABS
2.0000 | ORAL_TABLET | ORAL | Status: DC
  Administered 2023-03-19 – 2023-03-20 (×2): 2 via ORAL
  Filled 2023-03-19 (×2): qty 2

## 2023-03-19 MED ORDER — PRENATAL MULTIVITAMIN CH
1.0000 | ORAL_TABLET | Freq: Every day | ORAL | Status: DC
  Administered 2023-03-20 – 2023-03-21 (×2): 1 via ORAL
  Filled 2023-03-19 (×2): qty 1

## 2023-03-19 MED ORDER — BENZOCAINE-MENTHOL 20-0.5 % EX AERO
1.0000 | INHALATION_SPRAY | CUTANEOUS | Status: DC | PRN
  Administered 2023-03-19: 1 via TOPICAL
  Filled 2023-03-19: qty 56

## 2023-03-19 MED ORDER — SODIUM CHLORIDE 0.9% FLUSH
3.0000 mL | Freq: Two times a day (BID) | INTRAVENOUS | Status: DC
  Administered 2023-03-19: 3 mL via INTRAVENOUS

## 2023-03-19 MED ORDER — WITCH HAZEL-GLYCERIN EX PADS: 1.0000 | MEDICATED_PAD | CUTANEOUS | Status: DC | PRN

## 2023-03-19 MED ORDER — ACETAMINOPHEN 325 MG PO TABS: 650.0000 mg | ORAL_TABLET | ORAL | Status: DC | PRN

## 2023-03-19 MED ORDER — SODIUM CHLORIDE 0.9% FLUSH: 3.0000 mL | INTRAVENOUS | Status: DC | PRN

## 2023-03-19 MED ORDER — FLEET ENEMA RE ENEM: 1.0000 | ENEMA | RECTAL | Status: DC | PRN

## 2023-03-19 MED ORDER — LACTATED RINGERS IV SOLN
500.0000 mL | Freq: Once | INTRAVENOUS | Status: AC
  Administered 2023-03-19: 500 mL via INTRAVENOUS

## 2023-03-19 MED ORDER — MEASLES, MUMPS & RUBELLA VAC IJ SOLR: 0.5000 mL | Freq: Once | INTRAMUSCULAR | Status: DC

## 2023-03-19 MED ORDER — OXYCODONE HCL 5 MG PO TABS: 5.0000 mg | ORAL_TABLET | ORAL | Status: DC | PRN

## 2023-03-19 MED ORDER — ONDANSETRON HCL 4 MG PO TABS: 4.0000 mg | ORAL_TABLET | ORAL | Status: DC | PRN

## 2023-03-19 MED ORDER — OXYTOCIN-SODIUM CHLORIDE 30-0.9 UT/500ML-% IV SOLN
1.0000 m[IU]/min | INTRAVENOUS | Status: DC
  Administered 2023-03-19: 2 m[IU]/min via INTRAVENOUS

## 2023-03-19 MED ORDER — ACETAMINOPHEN 325 MG PO TABS
650.0000 mg | ORAL_TABLET | ORAL | Status: DC | PRN
  Administered 2023-03-20 (×2): 650 mg via ORAL
  Filled 2023-03-19 (×2): qty 2

## 2023-03-19 MED ORDER — FENTANYL-BUPIVACAINE-NACL 0.5-0.125-0.9 MG/250ML-% EP SOLN
12.0000 mL/h | EPIDURAL | Status: DC | PRN
Start: 2023-03-19 — End: 2023-03-19

## 2023-03-19 MED ORDER — COCONUT OIL OIL: 1.0000 | TOPICAL_OIL | Status: DC | PRN

## 2023-03-19 MED ORDER — ONDANSETRON HCL 4 MG/2ML IJ SOLN: 4.0000 mg | INTRAMUSCULAR | Status: DC | PRN

## 2023-03-19 MED ORDER — OXYTOCIN BOLUS FROM INFUSION: 333.0000 mL | Freq: Once | INTRAVENOUS | Status: DC

## 2023-03-19 MED ORDER — LACTATED RINGERS IV SOLN: 500.0000 mL | INTRAVENOUS | Status: DC | PRN

## 2023-03-19 MED ORDER — FENTANYL CITRATE (PF) 100 MCG/2ML IJ SOLN
50.0000 ug | Freq: Once | INTRAMUSCULAR | Status: AC
  Administered 2023-03-19: 50 ug via INTRAVENOUS
  Filled 2023-03-19: qty 2

## 2023-03-19 MED ORDER — TETANUS-DIPHTH-ACELL PERTUSSIS 5-2.5-18.5 LF-MCG/0.5 IM SUSY: 0.5000 mL | PREFILLED_SYRINGE | Freq: Once | INTRAMUSCULAR | Status: DC

## 2023-03-19 MED ORDER — SIMETHICONE 80 MG PO CHEW: 80.0000 mg | CHEWABLE_TABLET | ORAL | Status: DC | PRN

## 2023-03-19 NOTE — Progress Notes (Signed)
 LABOR PROGRESS NOTE  Patient Name: Katherine Barrera, female   DOB: [birth year], 23 y.o.  MRN: [id number]  Patient feeling lots of pressure. Complete; 0 station. Contractions starting to space. Will start pitocin with position changes for 1 hour, then start pushing. Cat I.  Joanne Gavel, MD

## 2023-03-19 NOTE — Progress Notes (Signed)
 Labor Progress Note Katherine Barrera is a 23 y.o. G2P0010 at [redacted]w[redacted]d presented for SOL S: Comfortable with epidural. Discussed role, risk, benefits of AROM; pt agreeable.   O:  BP 120/71   Pulse (!) 102   Temp 98 F (36.7 C) (Oral)   Resp 16   Ht 5\' 3"  (1.6 m)   Wt 96.7 kg   LMP 06/28/2022   SpO2 99%   BMI 37.77 kg/m   EFM: baseline 150, accels, no decels, moderate variability TOCO: q2-45min contractions  CVE: Dilation: 4.5 Effacement (%): 90, 100 Station: 0 Presentation: Vertex Exam by:: Leanora Cover, MD   A&P: 23 y.o. G2P0010 [redacted]w[redacted]d admitted for SOL #Labor: Progressing well. AROM clear.  #Pain: Epidural #FWB: Cat I #GBS negative  No circumcision desired for babe.  Wyn Forster, MD 4:57 AM

## 2023-03-19 NOTE — H&P (Addendum)
 OBSTETRIC ADMISSION HISTORY AND PHYSICAL  Katherine Barrera is a 23 y.o. female G2P0010 with IUP at [redacted]w[redacted]d by LMP presenting for SOL. She reports +FMs, No LOF, no VB, no blurry vision, headaches or peripheral edema, and RUQ pain.  She plans on breast feeding. She's unsure for birth control. She received her prenatal care at  [facility]    Dating: By LMP --->  Estimated Date of Delivery: 04/04/23  Sono:    @[redacted]w[redacted]d , CWD, normal anatomy, cephalic presentation, posterior placental lie, 834g, 55% EFW   Prenatal History/Complications:  Patient Active Problem List   Diagnosis Date Noted   Supervision of other normal pregnancy, antepartum 09/05/2022   Vitamin D deficiency 04/02/2017   Anxiety state 02/22/2015   Mild headache 02/22/2015   Numbness and tingling 02/22/2015   Chronic back pain 02/22/2015   NURSING  PROVIDER  Office Location [facility] Dating by LMP c/w U/S at 9 wks  [facility] Model Traditional Anatomy U/S WNL  Initiated care at  [facility]                 Language  English               LAB RESULTS   Support Person Husband Genetics NIPS: Declined AFP: Declined       NT/IT (FT only)        Carrier Screen Horizon: Declined  Rhogam  O/Positive/-- (08/20 0934) A1C/GTT Early:  Third trimester: normal  Flu Vaccine  11/17/2022      TDaP Vaccine  01/12/23 Blood Type O/Positive/-- (08/20 0934)  Covid Vaccine Yes Antibody Negative (08/20 0934)      Rubella 1.09 (08/20 0934)  Feeding Plan breast RPR Non Reactive (12/27 0925)  Contraception unsure HBsAg Negative (08/20 0934)  Circumcision unsure HIV Non Reactive (12/27 0925)  Pediatrician   [facility] HCVAb Non Reactive (08/20 0934)  Prenatal Classes            Pap No results found for: "DIAGPAP"  BTLConsent   GC/CT Initial:   36wks:    VBAC  Consent   GBS Negative/-- (02/21 0930) For PCN allergy, check sensitivities            DME Rx [X]  BP cuff [ ]  Weight Scale Waterbirth  [ ]  Class [ ]  Consent [ ]  CNM visit  PHQ9 & GAD7 [X]  new  OB Arly.Keller ] 28 weeks  [X]  36 weeks Induction  [ ]  Orders Entered [ ] Foley Y/N    Past Medical History: Past Medical History:  Diagnosis Date   Scoliosis     Past Surgical History: Past Surgical History:  Procedure Laterality Date   TONSILLECTOMY Bilateral    TONSILLECTOMY      Obstetrical History: OB History     Gravida  2   Para  0   Term  0   Preterm  0   AB  1   Living  0      SAB  1   IAB  0   Ectopic  0   Multiple  0   Live Births  0           Social History Social History   Socioeconomic History   Marital status: Married    Spouse name: Not on file   Number of children: Not on file   Years of education: Not on file   Highest education level: Not on file  Occupational History   Not on file  Tobacco Use   Smoking status: Never  Smokeless tobacco: Never  Vaping Use   Vaping status: Never Used  Substance and Sexual Activity   Alcohol use: No   Drug use: Never   Sexual activity: Yes  Other Topics Concern   Not on file  Social History Narrative   Rushie is home schooled. She is doing well.   Lives with her parents and younger sister. She enjoys exercise, reading, and painting   Social Drivers of Corporate investment banker Strain: Not on File (05/05/2021)   [facility]   [employer]    Financial Resource Strain: 0  Food Insecurity: Not on File (10/12/2022)   Received from [employer]    Food: 0  Transportation Needs: Not on File (05/05/2021)   Received from [employer], [employer] Needs    Transportation: 0  Physical Activity: Not on File (05/05/2021)   Received from [place], [place]   Physical Activity    Physical Activity: 0  Stress: Not on File (05/05/2021)   Received from [facility], [place]   Stress    Stress: 0  Social Connections: Not on File (09/30/2022)   Received from [employer]   Social Connections    Connectedness: 0    Family History: Family History  Problem Relation Age of  Onset   Bipolar disorder Mother    Anxiety disorder Mother    Diabetes Paternal Grandmother    Kidney failure Paternal Grandfather    Epilepsy Cousin    Epilepsy Cousin        <1 Maternal 1st cousin has epilepsy   Schizophrenia Other     Allergies: Allergies  Allergen Reactions   Other Swelling and Rash    Nuts; hazelnuts cause rash/swelling inside of the mouth   Pecan Nut (Diagnostic)     Medications Prior to Admission  Medication Sig Dispense Refill Last Dose/Taking   Blood Pressure Monitoring (BLOOD PRESSURE KIT) DEVI 1 Device by Does not apply route once a week. 1 each 0    Prenatal Vit-Fe Fumarate-FA (PRENATAL VITAMIN PO) Take 1 tablet by mouth daily.        Review of Systems   All systems reviewed and negative except as stated in HPI  Blood pressure 137/76, pulse 87, temperature 98 F (36.7 C), temperature source Oral, resp. rate 19, height 5\' 3"  (1.6 m), weight 96.7 kg, last menstrual period 06/28/2022, SpO2 100%. General appearance: alert, cooperative, and appears stated age Lungs: clear to auscultation bilaterally Heart: regular rate and rhythm Abdomen: soft, non-tender; bowel sounds normal Pelvic: normal female genitalia  Extremities: Homans sign is negative, no sign of DVT Presentation: cephalic Fetal monitoringBaseline: 150 bpm, Variability: Good {> 6 bpm), Accelerations: Reactive, and Decelerations: Absent Uterine activity q2-3min Dilation: 4 Effacement (%): 80 Station: 0 Exam by:: Joline Salt RN   Prenatal labs: ABO, Rh: O/Positive/-- (08/20 0934) Antibody: Negative (08/20 0934) Rubella: 1.09 (08/20 0934) RPR: Non Reactive (12/27 0925)  HBsAg: Negative (08/20 0934)  HIV: Non Reactive (12/27 0925)  GBS: Negative/-- (02/21 0930)    Lab Results  Component Value Date   GBS Negative 03/09/2023   GTT nrl Genetic screening  declined Anatomy US nrl  Immunization History  Administered Date(s) Administered   Influenza, Seasonal, Injecte,  Preservative Fre 11/17/2022   PFIZER(Purple Top)SARS-COV-2 Vaccination 02/07/2020   Tdap 01/12/2023    Prenatal Transfer Tool  Maternal Diabetes: No Genetic Screening: Declined Maternal Ultrasounds/Referrals: Normal Fetal Ultrasounds or other Referrals:  None Maternal Substance Abuse:  No Significant Maternal Medications:  None Significant Maternal Lab Results: Group B Strep negative Number of Prenatal Visits:greater than 3 verified prenatal visits Maternal Vaccinations:TDap and Flu Other Comments:  None   Results for orders placed or performed during the hospital encounter of 03/18/23 (from the past 24 hours)  Urinalysis, Routine w reflex microscopic -Urine, Clean Catch   Collection Time: 03/18/23  4:32 PM  Result Value Ref Range   Color, Urine YELLOW YELLOW   APPearance HAZY (A) CLEAR   Specific Gravity, Urine 1.023 1.005 - 1.030   pH 7.0 5.0 - 8.0   Glucose, UA NEGATIVE NEGATIVE mg/dL   Hgb urine dipstick SMALL (A) NEGATIVE   Bilirubin Urine NEGATIVE NEGATIVE   Ketones, ur NEGATIVE NEGATIVE mg/dL   Protein, ur 30 (A) NEGATIVE mg/dL   Nitrite NEGATIVE NEGATIVE   Leukocytes,Ua MODERATE (A) NEGATIVE   RBC / HPF 0-5 0 - 5 RBC/hpf   WBC, UA >50 0 - 5 WBC/hpf   Bacteria, UA RARE (A) NONE SEEN   Squamous Epithelial / HPF 11-20 0 - 5 /HPF   Mucus PRESENT     Patient Active Problem List   Diagnosis Date Noted   Supervision of other normal pregnancy, antepartum 09/05/2022   Vitamin D deficiency 04/02/2017   Anxiety state 02/22/2015   Mild headache 02/22/2015   Numbness and tingling 02/22/2015   Chronic back pain 02/22/2015    Assessment/Plan:  Katherine Barrera is a 23 y.o. G2P0010 at [redacted]w[redacted]d here for SOL.  #Labor:expectant management vs low dose pitocin / AROM #Pain: Per patient request #FWB: Cat1 #GBS status:  negative #Feeding: Breastmilk  #Reproductive Life planning:  unsure #Circ:  Unsure   #Anxiety (not on meds)  Hessie Dibble, MD  03/19/2023, 2:58  AM

## 2023-03-19 NOTE — Anesthesia Preprocedure Evaluation (Signed)
 Anesthesia Evaluation  Patient identified by MRN, date of birth, ID band Patient awake    Reviewed: Allergy & Precautions, H&P , NPO status , Patient's Chart, lab work & pertinent test results, reviewed documented beta blocker date and time   Airway Mallampati: III  TM Distance: >3 FB Neck ROM: full    Dental no notable dental hx. (+) Teeth Intact, Dental Advisory Given   Pulmonary neg pulmonary ROS   Pulmonary exam normal breath sounds clear to auscultation       Cardiovascular negative cardio ROS Normal cardiovascular exam Rhythm:regular Rate:Normal     Neuro/Psych negative neurological ROS  negative psych ROS   GI/Hepatic negative GI ROS, Neg liver ROS,,,  Endo/Other    Class 3 obesity  Renal/GU negative Renal ROS  negative genitourinary   Musculoskeletal   Abdominal   Peds  Hematology negative hematology ROS (+)   Anesthesia Other Findings   Reproductive/Obstetrics (+) Pregnancy                             Anesthesia Physical Anesthesia Plan  ASA: 2  Anesthesia Plan: Epidural   Post-op Pain Management: Minimal or no pain anticipated   Induction: Intravenous  PONV Risk Score and Plan: 2 and Ondansetron  Airway Management Planned: Natural Airway and Simple Face Mask  Additional Equipment: None  Intra-op Plan:   Post-operative Plan: Extubation in OR  Informed Consent: I have reviewed the patients History and Physical, chart, labs and discussed the procedure including the risks, benefits and alternatives for the proposed anesthesia with the patient or authorized representative who has indicated his/her understanding and acceptance.     Dental Advisory Given  Plan Discussed with: Anesthesiologist  Anesthesia Plan Comments: (Labs checked- platelets confirmed with RN in room. Fetal heart tracing, per RN, reported to be stable enough for sitting procedure. Discussed  epidural, and patient consents to the procedure:  included risk of possible headache,backache, failed block, allergic reaction, and nerve injury. This patient was asked if she had any questions or concerns before the procedure started.)       Anesthesia Quick Evaluation

## 2023-03-19 NOTE — Anesthesia Procedure Notes (Signed)
 Epidural Patient location during procedure: OB Start time: 03/19/2023 4:16 AM End time: 03/19/2023 4:23 AM  Staffing Anesthesiologist: Bethena Midget, MD  Preanesthetic Checklist Completed: patient identified, IV checked, site marked, risks and benefits discussed, surgical consent, monitors and equipment checked, pre-op evaluation and timeout performed  Epidural Patient position: sitting Prep: DuraPrep and site prepped and draped Patient monitoring: continuous pulse ox and blood pressure Approach: midline Location: L4-L5 Injection technique: LOR air  Needle:  Needle type: Tuohy  Needle gauge: 17 G Needle length: 9 cm and 9 Needle insertion depth: 8 cm Catheter type: closed end flexible Catheter size: 19 Gauge Catheter at skin depth: 14 cm Test dose: negative  Assessment Events: blood not aspirated, no cerebrospinal fluid, injection not painful, no injection resistance, no paresthesia and negative IV test

## 2023-03-19 NOTE — Lactation Note (Signed)
 This note was copied from a baby's chart. Lactation Consultation Note  Patient Name: Katherine Barrera Date: 03/19/2023 Age:23 hours Reason for consult: Initial assessment;Primapara;Early term 37-38.6wks Baby is very sleepy. Not interested in BF. Wouldn't open mouth. LC used gloved finger for stimulation and suck training. Baby suck a couple of times that was it. In upright position gave some formula.  Newborn feeding habits, behavior, STS, I&O, positioning reviewed. Mom encouraged to feed baby 8-12 times/24 hours and with feeding cues.  Baby very sleepy. Has facial bruising. Discussed possibility of getting jaundice. Mom is BF/formula. Suggested try BF first before formula. Mom has DEBP at bedside. LC reviewed how to use DEBP & how to disassemble, clean, & reassemble parts. #24 flange needed.   Gave mom shells to wear in am. Mom may end up needing NS. Suggested mom pre-pump before latching to breast to evert nipples. Demonstrated hand pump and DEBP. Discussed pace feeding when supplementing. Answered questions mom had. Encouraged to call for assistance as needed.  Maternal Data Has patient been taught Hand Expression?: Yes Does the patient have breastfeeding experience prior to this delivery?: No  Feeding Nipple Type: Slow - flow  LATCH Score Latch: Too sleepy or reluctant, no latch achieved, no sucking elicited.  Audible Swallowing: None  Type of Nipple: Flat  Comfort (Breast/Nipple): Soft / non-tender  Hold (Positioning): Full assist, staff holds infant at breast  LATCH Score: 3   Lactation Tools Discussed/Used Tools: Shells;Pump;Flanges Flange Size: 24 Breast pump type: Double-Electric Breast Pump Pump Education: Setup, frequency, and cleaning;Milk Storage Reason for Pumping: flat Pumping frequency: q 3hr  Interventions Interventions: Breast feeding basics reviewed;Assisted with latch;Skin to skin;Hand express;Pre-pump if needed;Breast  compression;Adjust position;Support pillows;Position options;Shells;DEBP;Education;Pace feeding;LC Services brochure  Discharge    Consult Status Consult Status: Follow-up Date: 03/20/23 Follow-up type: In-patient    Charyl Dancer 03/19/2023, 9:23 PM

## 2023-03-19 NOTE — MAU Note (Signed)
..  Katherine Barrera is a 23 y.o. at [redacted]w[redacted]d here in MAU reporting: contractions since this afternoon that are now every 2 minutes. Denies vaginal bleeding or leaking of fluid. +FM Was here earlier and was 2.5 cm  Pain score: 8/10 Vitals:   03/19/23 0143  BP: 137/76  Pulse: 87  Resp: 19  Temp: 98 F (36.7 C)  SpO2: 100%     FHT:154 Lab orders placed from triage:  MAU labor eval

## 2023-03-19 NOTE — Discharge Summary (Shared)
 Postpartum Discharge Summary  Date of Service updated***     Patient Name: Katherine Barrera DOB: March 28, 2000 MRN: [id number]  Date of admission: 03/19/2023 Delivery date:03/19/2023 Delivering provider: Joanne Gavel Date of discharge: 03/19/2023  Admitting diagnosis: Normal labor [O80, Z37.9] Intrauterine pregnancy: [redacted]w[redacted]d     Secondary diagnosis:  Principal Problem:   NSVD (normal spontaneous vaginal delivery) Active Problems:   Anxiety state   Supervision of other normal pregnancy, antepartum  Additional problems: ***    Discharge diagnosis: Term Pregnancy Delivered                                              Post partum procedures:*** Augmentation: AROM and Pitocin Complications: None  Hospital course: Onset of Labor With Vaginal Delivery      23 y.o. yo G2P0010 at [redacted]w[redacted]d was admitted in Latent Labor on 03/19/2023. Labor course was complicated by none.  Membrane Rupture Time/Date: 4:55 AM,03/19/2023  Delivery Method:Vaginal, Spontaneous Operative Delivery:N/A Episiotomy: None Lacerations:  Perineal;2nd degree;Periurethral;Labial Patient had a postpartum course complicated by ***.  She is ambulating, tolerating a regular diet, passing flatus, and urinating well. Patient is discharged home in stable condition on 03/19/23.  Newborn Data: Birth date:03/19/2023 Birth time:12:49 PM Gender:Female Living status:Living Apgars:8 ,9  Weight:   Magnesium Sulfate received: No BMZ received: No Rhophylac:No MMR:No T-DaP:Given prenatally Flu: Yes RSV Vaccine received: No Transfusion:{Transfusion received:30440034}  Immunizations received: Immunization History  Administered Date(s) Administered   Influenza, Seasonal, Injecte, Preservative Fre 11/17/2022   PFIZER(Purple Top)SARS-COV-2 Vaccination 02/07/2020   Tdap 01/12/2023    Physical exam  Vitals:   03/19/23 1000 03/19/23 1030 03/19/23 1100 03/19/23 1230  BP: 111/64 105/67 124/87 (!) 143/61  Pulse: 89 94 98  (!) 103  Resp: 16 16 16    Temp:   98.1 F (36.7 C)   TempSrc:   Oral   SpO2:      Weight:      Height:       General: {Exam; general:21111117} Lochia: {Desc; appropriate/inappropriate:30686::"appropriate"} Uterine Fundus: {Desc; firm/soft:30687} Incision: {Exam; incision:21111123} DVT Evaluation: {Exam; dvt:2111122} Labs: Lab Results  Component Value Date   WBC 12.0 (H) 03/19/2023   HGB 12.2 03/19/2023   HCT 36.0 03/19/2023   MCV 92.8 03/19/2023   PLT 231 03/19/2023       No data to display         Edinburgh Score:     No data to display         No data recorded  After visit meds:  Allergies as of 03/19/2023       Reactions   Other Swelling, Rash   Nuts; hazelnuts cause rash/swelling inside of the mouth   Pecan Nut (diagnostic)      Med Rec must be completed prior to using this [facility]***        Discharge home in stable condition Infant Feeding: {Baby feeding:23562} Infant Disposition:{CHL IP OB HOME WITH WUJWJX:91478} Discharge instruction: per After Visit Summary and Postpartum booklet. Activity: Advance as tolerated. Pelvic rest for 6 weeks.  Diet: {OB GNFA:21308657} Future Appointments: Future Appointments  Date Time Provider Department Center  03/23/2023  8:55 AM Sue Lush, FNP [facility] None  03/30/2023 11:15 AM Gerrit Heck, CNM [facility] None   Follow up Visit:  Message sent to [facility] 3/3  Please schedule this patient for a In person postpartum visit  in 6 weeks with the following provider: Any provider. Additional Postpartum F/U: None   Low risk pregnancy complicated by:  none Delivery mode:  Vaginal, Spontaneous Anticipated Birth Control:  Unsure   03/19/2023 Joanne Gavel, MD

## 2023-03-20 LAB — CBC
HCT: 28.2 % — ABNORMAL LOW (ref 36.0–46.0)
Hemoglobin: 9.6 g/dL — ABNORMAL LOW (ref 12.0–15.0)
MCH: 31.6 pg (ref 26.0–34.0)
MCHC: 34 g/dL (ref 30.0–36.0)
MCV: 92.8 fL (ref 80.0–100.0)
Platelets: 169 10*3/uL (ref 150–400)
RBC: 3.04 MIL/uL — ABNORMAL LOW (ref 3.87–5.11)
RDW: 14.5 % (ref 11.5–15.5)
WBC: 13 10*3/uL — ABNORMAL HIGH (ref 4.0–10.5)
nRBC: 0 % (ref 0.0–0.2)

## 2023-03-20 LAB — NO BLOOD PRODUCTS

## 2023-03-20 LAB — BIRTH TISSUE RECOVERY COLLECTION (PLACENTA DONATION)

## 2023-03-20 MED ORDER — IBUPROFEN 600 MG PO TABS: 600.0000 mg | ORAL_TABLET | Freq: Four times a day (QID) | ORAL | 0 refills | Status: AC

## 2023-03-20 MED ORDER — WITCH HAZEL-GLYCERIN EX PADS: 1.0000 | MEDICATED_PAD | CUTANEOUS | Status: AC | PRN

## 2023-03-20 MED ORDER — ACETAMINOPHEN 325 MG PO TABS: 650.0000 mg | ORAL_TABLET | ORAL | Status: AC | PRN

## 2023-03-20 MED ORDER — COCONUT OIL OIL: 1.0000 | TOPICAL_OIL | Status: AC | PRN

## 2023-03-20 NOTE — Social Work (Signed)
 MOB was referred for history of depression/anxiety.  * Referral screened out by Clinical Social Worker because none of the following criteria appear to apply:  ~ History of anxiety/depression during this pregnancy, or of post-partum depression following prior delivery.  ~ Diagnosis of anxiety and/or depression within last 3 years OR * MOB's symptoms currently being treated with medication and/or therapy.  Per chart review MOB diagnosis dates back to 2017, per OB records no MH concerns during the pregnancy. Edinburgh =0  Please contact the Clinical Social Worker if needs arise, or by MOB request.   Wende Neighbors, LCSWA Clinical Social Worker [phone]

## 2023-03-20 NOTE — Lactation Note (Signed)
 This note was copied from a baby's chart. Lactation Consultation Note  Patient Name: Katherine Barrera ZOXWR'U Date: 03/20/2023 Age:23 hours Reason for consult: Follow-up assessment;1st time breastfeeding;Early term 37-38.6wks  P1, Baby [redacted]w[redacted]d.  Reviewed hand expression with permission.  Mother wearing shells. Baby recently had a bottle of formula.   Suggest calling for help with the next feeding.    Maternal Data Has patient been taught Hand Expression?: Yes Does the patient have breastfeeding experience prior to this delivery?: No  Feeding Mother's Current Feeding Choice: Breast Milk and Formula Nipple Type: Slow - flow  Lactation Tools Discussed/Used Tools: Shells  Interventions Interventions: Education  Discharge    Consult Status Consult Status: Follow-up Date: 03/21/23 Follow-up type: In-patient    [name] [facility] 03/20/2023, 10:39 AM

## 2023-03-20 NOTE — Anesthesia Postprocedure Evaluation (Signed)
 Anesthesia Post Note  Patient: Katherine Barrera  Procedure(s) Performed: AN AD HOC LABOR EPIDURAL     Patient location during evaluation: Mother Baby Anesthesia Type: Epidural Level of consciousness: awake, oriented and awake and alert Pain management: pain level controlled Vital Signs Assessment: post-procedure vital signs reviewed and stable Respiratory status: spontaneous breathing, respiratory function stable and nonlabored ventilation Cardiovascular status: stable Postop Assessment: no headache, adequate PO intake, able to ambulate, patient able to bend at knees and no apparent nausea or vomiting Anesthetic complications: no   No notable events documented.  Last Vitals:  Vitals:   03/20/23 0350 03/20/23 0653  BP: 101/63 (!) (P) 112/59  Pulse: 86   Resp: 18 18  Temp: 36.6 C   SpO2: 99%     Last Pain:  Vitals:   03/20/23 0901  TempSrc:   PainSc: 0-No pain   Pain Goal:                   Dawayne Ohair

## 2023-03-20 NOTE — Lactation Note (Signed)
 This note was copied from a baby's chart. Lactation Consultation Note  Patient Name: Katherine Barrera ZOXWR'U Date: 03/20/2023 Age:23 hours Reason for consult: Follow-up assessment;Primapara;1st time breastfeeding;Early term 37-38.6wks;Breastfeeding assistance  P1- LC offered latching assistance and MOB receptive. LC placed infant on the left breast in the football hold. Infant was very alert at this time and rooting on the breast. LC demonstrated how to sandwich the breast and entice infant into latching deeply. Infant latched very well and nursed for 20 minutes. Infant need some stimulation to sustain sucking. MOB denied having further questions or concerns. LC encouraged MOB to call for further assistance as needed.  Maternal Data Has patient been taught Hand Expression?: Yes Does the patient have breastfeeding experience prior to this delivery?: No  Feeding Mother's Current Feeding Choice: Breast Milk  LATCH Score Latch: Grasps breast easily, tongue down, lips flanged, rhythmical sucking.  Audible Swallowing: A few with stimulation  Type of Nipple: Everted at rest and after stimulation  Comfort (Breast/Nipple): Soft / non-tender  Hold (Positioning): Assistance needed to correctly position infant at breast and maintain latch.  LATCH Score: 8   Lactation Tools Discussed/Used Tools: Pump;Flanges;Shells Breast pump type: Double-Electric Breast Pump;Manual Pump Education: Setup, frequency, and cleaning;Milk Storage Reason for Pumping: MOB request Pumping frequency: 15-20 min every 3 hrs as needed  Interventions Interventions: Breast feeding basics reviewed;Assisted with latch;Hand express;Breast compression;Adjust position;Support pillows;Position options;Expressed milk;Education;LC Services brochure  Discharge Discharge Education: Warning signs for feeding baby Pump: DEBP;Personal  Consult Status Consult Status: Follow-up Date: 03/21/23 Follow-up type:  In-patient    Dema Severin BS, IBCLC 03/20/2023, 9:02 PM

## 2023-03-20 NOTE — Lactation Note (Signed)
 This note was copied from a baby's chart. Lactation Consultation Note  Patient Name: Katherine Barrera ZOXWR'U Date: 03/20/2023 Age:23 hours Reason for consult: Follow-up assessment;1st time breastfeeding;Early term 37-38.6wks;Infant < 6lbs  P1, Returned to room to assist with breastfeeding since mother has not called.  Baby recently consumed bottle of formula.  Reviewed hand expression with mother's permission and assisted with latching.  Baby did not open to feed. Helped mother pump with DEBP using 21 mm flanges. Encouraged offering the breast before formula.  Marland Kitchenapc Suggest calling for help with next feeding. Mother is wearing shells in between feeding to help evert nipples.    Maternal Data Has patient been taught Hand Expression?: Yes Does the patient have breastfeeding experience prior to this delivery?: No  Feeding Mother's Current Feeding Choice: Breast Milk and Formula Nipple Type: Slow - flow  Lactation Tools Discussed/Used Tools: Pump;Flanges Flange Size: 21 Breast pump type: Double-Electric Breast Pump Pump Education: Setup, frequency, and cleaning Reason for Pumping: stimulation and supplementation Pumping frequency: q 3 hours  Interventions Interventions: Breast feeding basics reviewed;Assisted with latch;Hand express;DEBP;Education  Discharge Pump: Personal;DEBP (Medela)  Consult Status Consult Status: Follow-up Date: 03/21/23 Follow-up type: In-patient    [name] [facility] 03/20/2023, 12:25 PM

## 2023-03-20 NOTE — Progress Notes (Signed)
 Subjective: Katherine Barrera is a 23 y.o. G2P1011 s/p NSVD at [redacted]w[redacted]d.  She reports she doing well. No acute events overnight. She denies any problems with ambulating, voiding or po intake. Denies nausea or vomiting. She has passed flatus. Pain is well controlled.  Lochia is appropriate.  Objective: Blood pressure (!) (P) 112/59, pulse 86, temperature 97.8 F (36.6 C), temperature source Oral, resp. rate 18, height 5\' 3"  (1.6 m), weight 96.7 kg, last menstrual period 06/28/2022, SpO2 99%, unknown if currently breastfeeding.  Physical Exam:  General: alert, cooperative and no distress Chest: no respiratory distress Abdomen: soft, non-tender  Uterine Fundus: firm and at level of umbilicus Extremities: No calf swelling or tenderness  no edema  Recent Labs    03/19/23 0329 03/20/23 0504  HGB 12.2 9.6*  HCT 36.0 28.2*    Assessment/Plan: Katherine Barrera is a 23 y.o. G2P1011 s/p NSVD at [redacted]w[redacted]d for SOL.  Routine Postpartum Care: Doing well, pain well-controlled.  -- Continue routine care, lactation support  -- Contraception: condoms -- Feeding: primarily breast feeding with bottle as needed  Dispo: Plan for discharge today if possible, otherwise tomorrow.  Drema Balzarine, MD Family Medicine Resident, PGY-1 03/20/2023 7:57 AM

## 2023-03-23 ENCOUNTER — Encounter: Payer: Medicaid Other | Admitting: Obstetrics and Gynecology

## 2023-03-27 ENCOUNTER — Telehealth (HOSPITAL_COMMUNITY): Payer: Self-pay

## 2023-03-27 NOTE — Telephone Encounter (Signed)
 03/27/2023 1601  Name: Katherine Barrera MRN: [id number] DOB: [birth year]  Reason for Call:  Transition of Care Hospital Discharge Call  Contact Status: Patient Contact Status: Complete  Language assistant needed:          Follow-Up Questions: Do You Have Any Concerns About Your Health As You Heal From Delivery?: Yes What Concerns Do You Have About Your Health?: Patient states that she has been having some left upper leg pain and the skin feels numb. She states that she feels like it is impoving and feeling better today. She states that the painful area feels swollen. RN told patient to call her OB after our call and discuss concern. Patient states that she will call. Patient has no other concerns or questions about her health or healing. Do You Have Any Concerns About Your Infants Health?: No  Edinburgh Postnatal Depression Scale:  In the Past 7 Days: I have been able to laugh and see the funny side of things.: As much as I always could I have looked forward with enjoyment to things.: As much as I ever did I have blamed myself unnecessarily when things went wrong.: No, never I have been anxious or worried for no good reason.: No, not at all I have felt scared or panicky for no good reason.: No, not at all Things have been getting on top of me.: No, I have been coping as well as ever I have been so unhappy that I have had difficulty sleeping.: Not at all I have felt sad or miserable.: No, not at all I have been so unhappy that I have been crying.: No, never The thought of harming myself has occurred to me.: Never Inocente Salles Postnatal Depression Scale Total: 0  PHQ2-9 Depression Scale:     Discharge Follow-up: Edinburgh score requires follow up?: No Patient was advised of the following resources:: Breastfeeding Support Group, Support Group Patient referred to:: OB  Post-discharge interventions: Reviewed Newborn Safe Sleep Practices  Signature  Signe Colt

## 2023-04-30 ENCOUNTER — Ambulatory Visit: Admitting: Obstetrics & Gynecology

## 2023-04-30 NOTE — Progress Notes (Signed)
 Post Partum Visit Note  Katherine Barrera is a 23 y.o. G93P1011 female who presents for a postpartum visit. She is 6 week postpartum following a normal spontaneous vaginal delivery.  I have fully reviewed the prenatal and intrapartum course. The delivery was at 37.5 gestational weeks.  Anesthesia: epidural. Postpartum course has been good. Baby is doing well yes. Baby is feeding by both breast and bottle - Similac Advance. Bleeding no bleeding. Bowel function is normal. Bladder function is normal. Patient is not sexually active. Contraception method is condoms. Postpartum depression screening: negative.   Upstream - 04/30/23 1330       Pregnancy Intention Screening   Does the patient want to become pregnant in the next year? No    Does the patient's partner want to become pregnant in the next year? No    Would the patient like to discuss contraceptive options today? No      Contraception Wrap Up   Current Method Female Condom    End Method Female Condom            The pregnancy intention screening data noted above was reviewed. Potential methods of contraception were discussed. The patient elected to proceed with Female Condom.   Edinburgh Postnatal Depression Scale - 04/30/23 1328       Edinburgh Postnatal Depression Scale:  In the Past 7 Days   I have been able to laugh and see the funny side of things. 0    I have looked forward with enjoyment to things. 0    I have blamed myself unnecessarily when things went wrong. 0    I have been anxious or worried for no good reason. 0    I have felt scared or panicky for no good reason. 0    Things have been getting on top of me. 0    I have been so unhappy that I have had difficulty sleeping. 0    I have felt sad or miserable. 0    I have been so unhappy that I have been crying. 0    The thought of harming myself has occurred to me. 0    Edinburgh Postnatal Depression Scale Total 0             Health Maintenance Due   Topic Date Due   HPV VACCINES (1 - 3-dose series) Never done   Meningococcal B Vaccine (1 of 2 - Standard) Never done   Cervical Cancer Screening (Pap smear)  Never done   COVID-19 Vaccine (2 - 2024-25 season) 09/17/2022    The following portions of the patient's history were reviewed and updated as appropriate: allergies, current medications, past family history, past medical history, past social history, past surgical history, and problem list.  Review of Systems Pertinent items noted in HPI and remainder of comprehensive ROS otherwise negative.  Objective:  BP 106/72   Pulse 71   Ht 5\' 3"  (1.6 m)   Wt 188 lb (85.3 kg)   LMP 06/28/2022 (Exact Date)   Breastfeeding Yes   BMI 33.30 kg/m    General:  alert and no distress   Breasts:  normal, done in the presence of chaperone  Lungs: clear to auscultation bilaterally  Heart:  regular rate and rhythm  Abdomen: soft, non-tender; bowel sounds normal; no masses,  no organomegaly   GU exam:  not indicated       Assessment:   Normal postpartum exam.   Plan:   Essential components of care  per ACOG recommendations:  1.  Mood and well being: Patient with negative depression screening today. Reviewed local resources for support.  - Patient tobacco use? No.   - hx of drug use? No.    2. Infant care and feeding:  -Patient currently breastmilk feeding? Yes. Reviewed importance of draining breast regularly to support lactation.  -Social determinants of health (SDOH) reviewed in EPIC. No concerns.  3. Sexuality, contraception and birth spacing - Patient does not want a pregnancy in the next year.   - Reviewed reproductive life planning. Reviewed contraceptive methods based on pt preferences and effectiveness.  Patient desired FAM or LAM and Female Condom today.   - Discussed birth spacing of 18 months  4. Sleep and fatigue -Encouraged family/partner/community support of 4 hrs of uninterrupted sleep to help with mood and  fatigue  5. Physical Recovery  - Discussed patients delivery and complications. She describes her labor as good. - Patient had a Vaginal, no problems at delivery. Patient had a 2nd degree laceration. Perineal healing reviewed. Patient expressed understanding - Patient has urinary incontinence? No. - Patient is safe to resume physical and sexual activity  6.  Health Maintenance - HM due items addressed Yes - Pap smear not done at today's visit due to patient's request, has upcoming appointment for annual. - Breast Cancer screening indicated? No.    Laporchia Nakajima, MD Center for [facility], [facility]

## 2023-08-03 ENCOUNTER — Encounter: Payer: Self-pay | Admitting: Advanced Practice Midwife

## 2023-09-28 ENCOUNTER — Telehealth: Admitting: Physician Assistant

## 2023-09-28 DIAGNOSIS — B3731 Acute candidiasis of vulva and vagina: Secondary | ICD-10-CM

## 2023-10-01 ENCOUNTER — Telehealth

## 2023-10-01 MED ORDER — FLUCONAZOLE 150 MG PO TABS: 150.0000 mg | ORAL_TABLET | ORAL | 0 refills | Status: AC | PRN

## 2023-10-01 NOTE — Progress Notes (Signed)
